# Patient Record
Sex: Female | Born: 1950 | Race: White | Hispanic: No | Marital: Married | Smoking: Never smoker
Health system: Southern US, Community
[De-identification: ages and names within clinical notes are randomized; demographics above are authoritative.]

## PROBLEM LIST (undated history)

## (undated) DIAGNOSIS — R002 Palpitations: Secondary | ICD-10-CM

## (undated) DIAGNOSIS — Z860100 Personal history of colon polyps, unspecified: Secondary | ICD-10-CM

## (undated) DIAGNOSIS — Z9889 Other specified postprocedural states: Secondary | ICD-10-CM

## (undated) DIAGNOSIS — R351 Nocturia: Secondary | ICD-10-CM

## (undated) DIAGNOSIS — I10 Essential (primary) hypertension: Secondary | ICD-10-CM

## (undated) DIAGNOSIS — M1990 Unspecified osteoarthritis, unspecified site: Secondary | ICD-10-CM

## (undated) DIAGNOSIS — R091 Pleurisy: Secondary | ICD-10-CM

## (undated) DIAGNOSIS — E785 Hyperlipidemia, unspecified: Secondary | ICD-10-CM

## (undated) DIAGNOSIS — R112 Nausea with vomiting, unspecified: Secondary | ICD-10-CM

## (undated) DIAGNOSIS — M549 Dorsalgia, unspecified: Secondary | ICD-10-CM

## (undated) DIAGNOSIS — M8580 Other specified disorders of bone density and structure, unspecified site: Secondary | ICD-10-CM

## (undated) DIAGNOSIS — E876 Hypokalemia: Secondary | ICD-10-CM

## (undated) DIAGNOSIS — R3915 Urgency of urination: Secondary | ICD-10-CM

## (undated) DIAGNOSIS — N393 Stress incontinence (female) (male): Secondary | ICD-10-CM

## (undated) HISTORY — PX: LUMBAR DISC SURGERY: SHX700

## (undated) HISTORY — PX: BACK SURGERY: SHX140

## (undated) HISTORY — PX: COLONOSCOPY: SHX174

## (undated) HISTORY — PX: KNEE ARTHROSCOPY W/ MEDIAL COLLATERAL LIGAMENT (MCL) REPAIR: SHX1876

---

## 1996-03-21 HISTORY — PX: TOTAL HIP ARTHROPLASTY: SHX124

## 1996-03-21 HISTORY — PX: OOPHORECTOMY: SHX86

## 2003-12-31 ENCOUNTER — Encounter (INDEPENDENT_AMBULATORY_CARE_PROVIDER_SITE_OTHER): Payer: Self-pay | Admitting: Specialist

## 2003-12-31 ENCOUNTER — Ambulatory Visit (HOSPITAL_COMMUNITY): Admission: RE | Admit: 2003-12-31 | Discharge: 2003-12-31 | Payer: Self-pay | Admitting: Gastroenterology

## 2004-02-25 ENCOUNTER — Other Ambulatory Visit: Admission: RE | Admit: 2004-02-25 | Discharge: 2004-02-25 | Payer: Self-pay | Admitting: Family Medicine

## 2005-03-07 ENCOUNTER — Other Ambulatory Visit: Admission: RE | Admit: 2005-03-07 | Discharge: 2005-03-07 | Payer: Self-pay | Admitting: Family Medicine

## 2005-10-27 ENCOUNTER — Encounter: Admission: RE | Admit: 2005-10-27 | Discharge: 2005-10-27 | Payer: Self-pay | Admitting: Orthopedic Surgery

## 2007-03-28 ENCOUNTER — Other Ambulatory Visit: Admission: RE | Admit: 2007-03-28 | Discharge: 2007-03-28 | Payer: Self-pay | Admitting: Family Medicine

## 2008-01-29 ENCOUNTER — Encounter: Admission: RE | Admit: 2008-01-29 | Discharge: 2008-01-29 | Payer: Self-pay | Admitting: Orthopedic Surgery

## 2009-04-22 ENCOUNTER — Other Ambulatory Visit: Admission: RE | Admit: 2009-04-22 | Discharge: 2009-04-22 | Payer: Self-pay | Admitting: Family Medicine

## 2010-08-06 NOTE — Op Note (Signed)
Kendra Mathis, Kendra Mathis                ACCOUNT NO.:  [id number]   MEDICAL RECORD NO.:  [id number]          PATIENT TYPE:  AMB   LOCATION:  ENDO                         FACILITY:  [facility]   PHYSICIAN:  John C. Madilyn Fireman, M.D.    DATE OF BIRTH:  1950-11-10   DATE OF PROCEDURE:  12/31/2003  DATE OF DISCHARGE:                                 OPERATIVE REPORT   PROCEDURE:  Colonoscopy with polypectomy.   INDICATION FOR PROCEDURE:  Average risk colon cancer screening,   DESCRIPTION OF PROCEDURE:  The patient was placed in the left lateral  decubitus position and placed on the pulse monitor with continuous low-flow  oxygen delivered by nasal cannula.  She was sedated with 50 mcg IV fentanyl  and 6 mg IV Versed.  The Olympus video colonoscope was inserted into the  rectum and advanced to the cecum, confirmed by transillumination at  McBurney's point and visualization of the ileocecal valve and appendiceal  orifice.  The prep was good.  In the base of the cecum, there was a 6 mm  sessile polyp that was fulgurated by hot biopsy.  The remainder of the  cecum, ascending, transverse, descending, sigmoid, and rectum appeared  normal with no further polyps, masses, diverticula, or other mucosal  abnormalities.  The scope was then withdrawn, and the patient returned to  the recovery room in stable condition.  She tolerated the procedure well,  and there were no immediate complications.   IMPRESSION:  1.  A 6 mm cecal polyp.  2.  Otherwise, normal colonoscopy.   PLAN:  Await histology to determine method and interval for future colon  screening.      JCH/MEDQ  D:  12/31/2003  T:  12/31/2003  Job:  [id number]   cc:   Dario Guardian, M.D.  [address]  [place]  [place] [address]  Fax: [phone]

## 2012-05-08 ENCOUNTER — Other Ambulatory Visit (HOSPITAL_COMMUNITY)
Admission: RE | Admit: 2012-05-08 | Discharge: 2012-05-08 | Disposition: A | Discharge status: Home / Self Care | Payer: BC Managed Care – PPO | Source: Ambulatory Visit | Attending: Family Medicine | Admitting: Family Medicine

## 2012-05-08 ENCOUNTER — Other Ambulatory Visit: Payer: Self-pay | Admitting: Family Medicine

## 2012-05-08 DIAGNOSIS — Z0000 Encounter for general adult medical examination without abnormal findings: Secondary | ICD-10-CM | POA: Insufficient documentation

## 2014-07-16 ENCOUNTER — Other Ambulatory Visit: Payer: Self-pay | Admitting: Family Medicine

## 2014-10-27 ENCOUNTER — Ambulatory Visit (INDEPENDENT_AMBULATORY_CARE_PROVIDER_SITE_OTHER): Payer: Medicare Other

## 2014-10-27 ENCOUNTER — Ambulatory Visit (INDEPENDENT_AMBULATORY_CARE_PROVIDER_SITE_OTHER): Payer: Medicare Other | Admitting: Podiatry

## 2014-10-27 VITALS — BP 127/71 | HR 60 | Resp 16

## 2014-10-27 DIAGNOSIS — M1990 Unspecified osteoarthritis, unspecified site: Secondary | ICD-10-CM

## 2014-10-27 DIAGNOSIS — M779 Enthesopathy, unspecified: Secondary | ICD-10-CM | POA: Diagnosis not present

## 2014-10-27 DIAGNOSIS — S96912A Strain of unspecified muscle and tendon at ankle and foot level, left foot, initial encounter: Secondary | ICD-10-CM

## 2014-10-27 MED ORDER — TRIAMCINOLONE ACETONIDE 10 MG/ML IJ SUSP: 10.0000 mg | Freq: Once | INTRAMUSCULAR | Status: DC

## 2014-10-27 NOTE — Progress Notes (Signed)
   Subjective:    Patient ID: Kendra Mathis, female    DOB: 17-Oct-1950, 64 y.o.   MRN: [id number]  HPI Pt presents with pain in left foot on the dorsal area. She states that she has had this pain with knot present for 2 years. Has had multiple injections to relieve pain. She is a Estate agent. Pain is worse after resting then ambulating again   Review of Systems  All other systems reviewed and are negative.      Objective:   Physical Exam        Assessment & Plan:

## 2014-10-28 NOTE — Progress Notes (Signed)
Subjective:     Patient ID: Kendra Mathis, female   DOB: 1950/05/26, 64 y.o.   MRN: [id number]  HPI patient states I've had pain on top of my left foot for a long time and I've got periodic injections from Dr. Thurston Hole and I want to see if there's anything else shoe could do an with you could do the injections. States that they help for a while but she has to have them periodically   Review of Systems  All other systems reviewed and are negative.      Objective:   Physical Exam  Constitutional: She is oriented to person, place, and time.  Cardiovascular: Intact distal pulses.   Musculoskeletal: Normal range of motion.  Neurological: She is oriented to person, place, and time.  Skin: Skin is warm.  Nursing note and vitals reviewed.  neurovascular status intact muscle strength adequate range of motion within normal limits with patient having inflammation and pain on top of the left foot midtarsal joint with fluid buildup noted and prominence indicating probable spur formation. 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 At this time I did a careful dorsal injection 3 mg Kenalog 5 mg Xylocaine and advised on heat ice therapy and loose-type shoes along with padding. Reappoint when symptomatic and ultimately this could require fusion

## 2014-10-30 DIAGNOSIS — M545 Low back pain: Secondary | ICD-10-CM | POA: Diagnosis not present

## 2014-10-30 DIAGNOSIS — M19072 Primary osteoarthritis, left ankle and foot: Secondary | ICD-10-CM | POA: Diagnosis not present

## 2014-10-30 DIAGNOSIS — M25551 Pain in right hip: Secondary | ICD-10-CM | POA: Diagnosis not present

## 2014-11-04 DIAGNOSIS — Z09 Encounter for follow-up examination after completed treatment for conditions other than malignant neoplasm: Secondary | ICD-10-CM | POA: Diagnosis not present

## 2014-11-04 DIAGNOSIS — M25551 Pain in right hip: Secondary | ICD-10-CM | POA: Diagnosis not present

## 2014-11-04 DIAGNOSIS — Z96641 Presence of right artificial hip joint: Secondary | ICD-10-CM | POA: Diagnosis not present

## 2014-11-12 ENCOUNTER — Other Ambulatory Visit: Payer: Self-pay | Admitting: Orthopedic Surgery

## 2014-12-18 ENCOUNTER — Encounter (HOSPITAL_COMMUNITY)
Admission: RE | Admit: 2014-12-18 | Discharge: 2014-12-18 | Disposition: A | Discharge status: Home / Self Care | Payer: Medicare Other | Source: Ambulatory Visit | Attending: Orthopedic Surgery | Admitting: Orthopedic Surgery

## 2014-12-18 ENCOUNTER — Encounter (HOSPITAL_COMMUNITY): Payer: Self-pay

## 2014-12-18 DIAGNOSIS — R9431 Abnormal electrocardiogram [ECG] [EKG]: Secondary | ICD-10-CM | POA: Insufficient documentation

## 2014-12-18 DIAGNOSIS — Z01818 Encounter for other preprocedural examination: Secondary | ICD-10-CM | POA: Insufficient documentation

## 2014-12-18 DIAGNOSIS — Z01812 Encounter for preprocedural laboratory examination: Secondary | ICD-10-CM | POA: Diagnosis not present

## 2014-12-18 DIAGNOSIS — Z0183 Encounter for blood typing: Secondary | ICD-10-CM | POA: Insufficient documentation

## 2014-12-18 DIAGNOSIS — I10 Essential (primary) hypertension: Secondary | ICD-10-CM | POA: Diagnosis not present

## 2014-12-18 DIAGNOSIS — R002 Palpitations: Secondary | ICD-10-CM | POA: Insufficient documentation

## 2014-12-18 HISTORY — DX: Other specified postprocedural states: Z98.890

## 2014-12-18 HISTORY — DX: Other specified disorders of bone density and structure, unspecified site: M85.80

## 2014-12-18 HISTORY — DX: Stress incontinence (female) (male): N39.3

## 2014-12-18 HISTORY — DX: Dorsalgia, unspecified: M54.9

## 2014-12-18 HISTORY — DX: Personal history of colonic polyps: Z86.010

## 2014-12-18 HISTORY — DX: Personal history of colon polyps, unspecified: Z86.0100

## 2014-12-18 HISTORY — DX: Palpitations: R00.2

## 2014-12-18 HISTORY — DX: Nocturia: R35.1

## 2014-12-18 HISTORY — DX: Unspecified osteoarthritis, unspecified site: M19.90

## 2014-12-18 HISTORY — DX: Hyperlipidemia, unspecified: E78.5

## 2014-12-18 HISTORY — DX: Essential (primary) hypertension: I10

## 2014-12-18 HISTORY — DX: Pleurisy: R09.1

## 2014-12-18 HISTORY — DX: Other specified postprocedural states: R11.2

## 2014-12-18 HISTORY — DX: Urgency of urination: R39.15

## 2014-12-18 LAB — BASIC METABOLIC PANEL
Anion gap: 8 (ref 5–15)
BUN: 24 mg/dL — AB (ref 6–20)
CHLORIDE: 106 mmol/L (ref 101–111)
CO2: 27 mmol/L (ref 22–32)
CREATININE: 0.86 mg/dL (ref 0.44–1.00)
Calcium: 9.7 mg/dL (ref 8.9–10.3)
GFR calc Af Amer: >60 mL/min (ref >60)
GFR calc non Af Amer: >60 mL/min (ref >60)
Glucose, Bld: 95 mg/dL (ref 65–99)
Potassium: 3.9 mmol/L (ref 3.5–5.1)
SODIUM: 141 mmol/L (ref 135–145)

## 2014-12-18 LAB — CBC WITH DIFFERENTIAL/PLATELET
Basophils Absolute: 0 10*3/uL (ref 0.0–0.1)
Basophils Relative: 0 %
EOS ABS: 0.1 10*3/uL (ref 0.0–0.7)
EOS PCT: 1 %
HCT: 39.4 % (ref 36.0–46.0)
Hemoglobin: 12.8 g/dL (ref 12.0–15.0)
LYMPHS ABS: 2 10*3/uL (ref 0.7–4.0)
Lymphocytes Relative: 31 %
MCH: 29.5 pg (ref 26.0–34.0)
MCHC: 32.5 g/dL (ref 30.0–36.0)
MCV: 90.8 fL (ref 78.0–100.0)
MONOS PCT: 6 %
Monocytes Absolute: 0.4 10*3/uL (ref 0.1–1.0)
Neutro Abs: 3.8 10*3/uL (ref 1.7–7.7)
Neutrophils Relative %: 62 %
PLATELETS: 274 10*3/uL (ref 150–400)
RBC: 4.34 MIL/uL (ref 3.87–5.11)
RDW: 14.4 % (ref 11.5–15.5)
WBC: 6.2 10*3/uL (ref 4.0–10.5)

## 2014-12-18 LAB — URINALYSIS, ROUTINE W REFLEX MICROSCOPIC
Bilirubin Urine: NEGATIVE
Glucose, UA: NEGATIVE mg/dL
Hgb urine dipstick: NEGATIVE
Ketones, ur: NEGATIVE mg/dL
LEUKOCYTES UA: NEGATIVE
NITRITE: NEGATIVE
PH: 5.5 (ref 5.0–8.0)
Protein, ur: NEGATIVE mg/dL
SPECIFIC GRAVITY, URINE: 1.022 (ref 1.005–1.030)
Urobilinogen, UA: 0.2 mg/dL (ref 0.0–1.0)

## 2014-12-18 LAB — TYPE AND SCREEN
ABO/RH(D): A POS
Antibody Screen: NEGATIVE

## 2014-12-18 LAB — PROTIME-INR
INR: 1.03 (ref 0.00–1.49)
PROTHROMBIN TIME: 13.7 s (ref 11.6–15.2)

## 2014-12-18 LAB — APTT: APTT: 26 s (ref 24–37)

## 2014-12-18 LAB — SURGICAL PCR SCREEN
MRSA, PCR: NEGATIVE
Staphylococcus aureus: POSITIVE — AB

## 2014-12-18 LAB — ABO/RH: ABO/RH(D): A POS

## 2014-12-18 MED ORDER — CHLORHEXIDINE GLUCONATE 4 % EX LIQD: 60.0000 mL | Freq: Once | CUTANEOUS | Status: DC

## 2014-12-18 NOTE — Progress Notes (Signed)
Mupirocin script called into [facility]

## 2014-12-18 NOTE — Progress Notes (Addendum)
Cardiologist denies having one  Medical Md is Dr.Candace Katrinka Blazing  Echo denies ever having one  Stress test > 59yrs ago  Heart cath denies ever having one  EKG denies having one in past yr   CXR denies having one in past yr

## 2014-12-18 NOTE — Pre-Procedure Instructions (Signed)
Kendra Mathis  12/18/2014      [facility] [id number] - [address], [place] - [address] [place] [address] Phone: [phone] Fax: [phone]    Your procedure is scheduled on Mon, Oct 10 @ 10:00 AM  Report to [facility] Admitting at 8:00 AM  Call this number if you have problems the morning of surgery:  [phone]   Remember:  Do not eat food or drink liquids after midnight.  Take these medicines the morning of surgery with A SIP OF WATER Propranolol(Inderal)              Stop taking your Naproxen along with any Vitamins or Herbal Medications a week prior to surgery. No Goody's,BC's,Aspirin,Ibuprofen,or Fish Oil.   Do not wear jewelry, make-up or nail polish.  Do not wear lotions, powders, or perfumes.  You may wear deodorant.  Do not shave 48 hours prior to surgery.   Do not bring valuables to the hospital.  [facility] is not responsible for any belongings or valuables.  Contacts, dentures or bridgework may not be worn into surgery.  Leave your suitcase in the car.  After surgery it may be brought to your room.  For patients admitted to the hospital, discharge time will be determined by your treatment team.  Patients discharged the day of surgery will not be allowed to drive home.    Special instructions:  [facility] - Preparing for Surgery  Before surgery, you can play an important role.  Because skin is not sterile, your skin needs to be as free of germs as possible.  You can reduce the number of germs on you skin by washing with CHG (chlorahexidine gluconate) soap before surgery.  CHG is an antiseptic cleaner which kills germs and bonds with the skin to continue killing germs even after washing.  Please DO NOT use if you have an allergy to CHG or antibacterial soaps.  If your skin becomes reddened/irritated stop using the CHG and inform your nurse when you arrive at Short Stay.  Do not shave (including legs and  underarms) for at least 48 hours prior to the first CHG shower.  You may shave your face.  Please follow these instructions carefully:   1.  Shower with CHG Soap the night before surgery and the                                morning of Surgery.  2.  If you choose to wash your hair, wash your hair first as usual with your       normal shampoo.  3.  After you shampoo, rinse your hair and body thoroughly to remove the                      Shampoo.  4.  Use CHG as you would any other liquid soap.  You can apply chg directly       to the skin and wash gently with scrungie or a clean washcloth.  5.  Apply the CHG Soap to your body ONLY FROM THE NECK DOWN.        Do not use on open wounds or open sores.  Avoid contact with your eyes,       ears, mouth and genitals (private parts).  Wash genitals (private parts)       with your normal soap.  6.  Wash thoroughly, paying special attention to the area where your surgery        will be performed.  7.  Thoroughly rinse your body with warm water from the neck down.  8.  DO NOT shower/wash with your normal soap after using and rinsing off       the CHG Soap.  9.  Pat yourself dry with a clean towel.            10.  Wear clean pajamas.            11.  Place clean sheets on your bed the night of your first shower and do not        sleep with pets.  Day of Surgery  Do not apply any lotions/deoderants the morning of surgery.  Please wear clean clothes to the hospital/surgery center.    Please read over the following fact sheets that you were given. Pain Booklet, Coughing and Deep Breathing, Blood Transfusion Information, MRSA Information and Surgical Site Infection Prevention

## 2014-12-26 NOTE — H&P (Signed)
TOTAL HIP REVISION ADMISSION H&P  Patient is admitted for right revision total hip arthroplasty.  Subjective:  Chief Complaint: right hip pain  HPI: Kendra Mathis, 64 y.o. female, has a history of pain and functional disability in the right hip due to arthritis and patient has failed non-surgical conservative treatments for greater than 12 weeks to include NSAID's and/or analgesics, flexibility and strengthening excercises, use of assistive devices, weight reduction as appropriate and activity modification. The indications for the revision total hip arthroplasty are bearing surface wear leading to  implant or hip misalignment.  Onset of symptoms was gradual starting 2 years ago with gradually worsening course since that time.  Prior procedures on the right hip include arthroplasty.  Patient currently rates pain in the right hip at 10 out of 10 with activity.  There is night pain, worsening of pain with activity and weight bearing, pain that interfers with activities of daily living and pain with passive range of motion. Patient has evidence of poly wear by imaging studies.  This condition presents safety issues increasing the risk of falls.    There is no current active infection.  There are no active problems to display for this patient.  Past Medical History  Diagnosis Date  . PONV (postoperative nausea and vomiting)   . Palpitations     takes Inderal daily  . Hypertension     takes Lisinopril-HCTZ daily  . Hyperlipidemia     hx of-tried Lovastatin but joint aches -has been off for several yrs   . Pleurisy     > 15 yrs ago  . Arthritis   . Back pain     scoliosis  . Osteopenia     takes Evista every other day  . History of colon polyps     benign  . Stress incontinence   . Urinary urgency   . Nocturia     Past Surgical History  Procedure Laterality Date  . Cesarean section    . Back surgery    . Ovary removed    . Joint replacement Right     hip replacement  . Knee  arthroscopy w/ medial collateral ligament (mcl) repair Left   . Colonoscopy      No prescriptions prior to admission   Allergies  Allergen Reactions  . Codeine Nausea Only    Social History  Substance Use Topics  . Smoking status: Never Smoker   . Smokeless tobacco: Not on file  . Alcohol Use: Yes     Comment: occasionally    No family history on file.    Review of Systems  Constitutional: Negative.   HENT: Positive for tinnitus.   Eyes: Negative.   Respiratory: Negative.   Cardiovascular: Negative.   Gastrointestinal: Negative.   Genitourinary: Negative.   Musculoskeletal: Positive for joint pain.  Skin: Negative.   Neurological: Negative.   Endo/Heme/Allergies: Negative.   Psychiatric/Behavioral: Negative.     Objective:  Physical Exam  Constitutional: She is oriented to person, place, and time. She appears well-developed and well-nourished.  HENT:  Head: Normocephalic and atraumatic.  Eyes: Pupils are equal, round, and reactive to light.  Neck: Normal range of motion. Neck supple.  Cardiovascular: Intact distal pulses.   Respiratory: Effort normal.  Musculoskeletal:  Internal and external rotation is 30 each foot tap is negative wound is well-healed there is no erythema and no swelling.    Neurological: She is alert and oriented to person, place, and time.  Skin: Skin is warm and dry.  Psychiatric: She has a normal mood and affect. Her behavior is normal. Judgment and thought content normal.    Vital signs in last 24 hours:     Labs:   There is no height or weight on file to calculate BMI.  Imaging Review:  Plain radiographs demonstrate the 26 mm head is now within 2 mm of the acetabular shell and locking ring superiorly, this is another 2-3 mm of migration compared to films were shot in 2009.  Assessment/Plan:  End stage arthritis, right hip(s) with failed previous arthroplasty.  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 Revision total hip arthroplasty is deemed medically necessary. The treatment options including medical management, injection therapy, arthroscopy and arthroplasty were discussed at length. The risks and benefits of total hip arthroplasty were presented and reviewed. The risks due to aseptic loosening, infection, stiffness, dislocation/subluxation,  thromboembolic complications and other imponderables were discussed.  The patient acknowledged the explanation, agreed to proceed with the plan and consent was signed. Patient is being admitted for inpatient treatment for surgery, pain control, PT, OT, prophylactic antibiotics, VTE prophylaxis, progressive ambulation and ADL's and discharge planning. The patient is planning to be discharged home with home health services

## 2014-12-26 NOTE — Progress Notes (Signed)
Patient returned call and voiced understanding about time of arrival.

## 2014-12-26 NOTE — Progress Notes (Signed)
Nurse called patient and left a voicemail instructing her to arrive at short stay at 0700 instead of 0800. Nurse also requested that patient return call at her earliest convenience to ensure she received this message.

## 2014-12-28 DIAGNOSIS — T84068A Wear of articular bearing surface of other internal prosthetic joint, initial encounter: Secondary | ICD-10-CM

## 2014-12-28 DIAGNOSIS — Z96649 Presence of unspecified artificial hip joint: Secondary | ICD-10-CM

## 2014-12-28 MED ORDER — TRANEXAMIC ACID 1000 MG/10ML IV SOLN
1000.0000 mg | INTRAVENOUS | Status: AC
  Administered 2014-12-29: 1000 mg via INTRAVENOUS
  Filled 2014-12-28: qty 10

## 2014-12-29 ENCOUNTER — Inpatient Hospital Stay (HOSPITAL_COMMUNITY): Payer: Medicare Other | Admitting: Anesthesiology

## 2014-12-29 ENCOUNTER — Encounter (HOSPITAL_COMMUNITY)
Admission: RE | Disposition: A | Discharge status: Home Health | Payer: Self-pay | Source: Ambulatory Visit | Attending: Orthopedic Surgery

## 2014-12-29 ENCOUNTER — Encounter (HOSPITAL_COMMUNITY): Payer: Self-pay | Admitting: General Practice

## 2014-12-29 ENCOUNTER — Inpatient Hospital Stay (HOSPITAL_COMMUNITY): Payer: Medicare Other

## 2014-12-29 ENCOUNTER — Inpatient Hospital Stay (HOSPITAL_COMMUNITY)
Admission: RE | Admit: 2014-12-29 | Discharge: 2014-12-30 | DRG: 467 | Disposition: A | Discharge status: Home Health | Payer: Medicare Other | Source: Ambulatory Visit | Attending: Orthopedic Surgery | Admitting: Orthopedic Surgery

## 2014-12-29 DIAGNOSIS — M549 Dorsalgia, unspecified: Secondary | ICD-10-CM | POA: Diagnosis not present

## 2014-12-29 DIAGNOSIS — Z23 Encounter for immunization: Secondary | ICD-10-CM | POA: Diagnosis not present

## 2014-12-29 DIAGNOSIS — M8580 Other specified disorders of bone density and structure, unspecified site: Secondary | ICD-10-CM | POA: Diagnosis present

## 2014-12-29 DIAGNOSIS — T84060A Wear of articular bearing surface of internal prosthetic right hip joint, initial encounter: Secondary | ICD-10-CM | POA: Diagnosis present

## 2014-12-29 DIAGNOSIS — M1611 Unilateral primary osteoarthritis, right hip: Secondary | ICD-10-CM | POA: Diagnosis present

## 2014-12-29 DIAGNOSIS — D62 Acute posthemorrhagic anemia: Secondary | ICD-10-CM | POA: Diagnosis not present

## 2014-12-29 DIAGNOSIS — T84068A Wear of articular bearing surface of other internal prosthetic joint, initial encounter: Secondary | ICD-10-CM

## 2014-12-29 DIAGNOSIS — E785 Hyperlipidemia, unspecified: Secondary | ICD-10-CM | POA: Diagnosis present

## 2014-12-29 DIAGNOSIS — M1990 Unspecified osteoarthritis, unspecified site: Secondary | ICD-10-CM | POA: Diagnosis not present

## 2014-12-29 DIAGNOSIS — I10 Essential (primary) hypertension: Secondary | ICD-10-CM | POA: Diagnosis present

## 2014-12-29 DIAGNOSIS — Z471 Aftercare following joint replacement surgery: Secondary | ICD-10-CM | POA: Diagnosis not present

## 2014-12-29 DIAGNOSIS — Z96641 Presence of right artificial hip joint: Secondary | ICD-10-CM | POA: Diagnosis not present

## 2014-12-29 DIAGNOSIS — Z96643 Presence of artificial hip joint, bilateral: Secondary | ICD-10-CM

## 2014-12-29 DIAGNOSIS — Z96649 Presence of unspecified artificial hip joint: Secondary | ICD-10-CM

## 2014-12-29 DIAGNOSIS — Z885 Allergy status to narcotic agent status: Secondary | ICD-10-CM | POA: Diagnosis not present

## 2014-12-29 DIAGNOSIS — M25551 Pain in right hip: Secondary | ICD-10-CM | POA: Diagnosis not present

## 2014-12-29 HISTORY — PX: REVISION TOTAL HIP ARTHROPLASTY: SHX766

## 2014-12-29 HISTORY — PX: TOTAL HIP REVISION: SHX763

## 2014-12-29 SURGERY — TOTAL HIP REVISION
Anesthesia: Monitor Anesthesia Care | Site: Hip | Laterality: Right

## 2014-12-29 MED ORDER — HYDROMORPHONE HCL 1 MG/ML IJ SOLN
0.5000 mg | INTRAMUSCULAR | Status: DC | PRN
Start: 2014-12-29 — End: 2014-12-30
  Administered 2014-12-29: 1 mg via INTRAVENOUS
  Administered 2014-12-29 – 2014-12-30 (×2): 0.5 mg via INTRAVENOUS
  Filled 2014-12-29 (×3): qty 1

## 2014-12-29 MED ORDER — INFLUENZA VAC SPLIT QUAD 0.5 ML IM SUSY
0.5000 mL | PREFILLED_SYRINGE | INTRAMUSCULAR | Status: AC
  Administered 2014-12-30: 0.5 mL via INTRAMUSCULAR
  Filled 2014-12-29: qty 0.5

## 2014-12-29 MED ORDER — ONDANSETRON HCL 4 MG PO TABS
4.0000 mg | ORAL_TABLET | Freq: Four times a day (QID) | ORAL | Status: DC | PRN
  Filled 2014-12-29: qty 1

## 2014-12-29 MED ORDER — LACTATED RINGERS IV SOLN
INTRAVENOUS | Status: DC | PRN
  Administered 2014-12-29 (×2): via INTRAVENOUS

## 2014-12-29 MED ORDER — HYDROCHLOROTHIAZIDE 12.5 MG PO CAPS
12.5000 mg | ORAL_CAPSULE | Freq: Every day | ORAL | Status: DC
  Administered 2014-12-29 – 2014-12-30 (×2): 12.5 mg via ORAL
  Filled 2014-12-29 (×2): qty 1

## 2014-12-29 MED ORDER — METOCLOPRAMIDE HCL 5 MG/ML IJ SOLN: 5.0000 mg | Freq: Three times a day (TID) | INTRAMUSCULAR | Status: DC | PRN

## 2014-12-29 MED ORDER — PROPRANOLOL HCL 40 MG PO TABS
40.0000 mg | ORAL_TABLET | Freq: Three times a day (TID) | ORAL | Status: DC
  Administered 2014-12-29 – 2014-12-30 (×3): 40 mg via ORAL
  Filled 2014-12-29 (×6): qty 1

## 2014-12-29 MED ORDER — SODIUM CHLORIDE 0.9 % IR SOLN
Status: DC | PRN
  Administered 2014-12-29: 1000 mL

## 2014-12-29 MED ORDER — PROPOFOL 10 MG/ML IV BOLUS
INTRAVENOUS | Status: DC | PRN
  Administered 2014-12-29 (×2): 10 mg via INTRAVENOUS
  Administered 2014-12-29: 20 mg via INTRAVENOUS

## 2014-12-29 MED ORDER — MIDAZOLAM HCL 2 MG/2ML IJ SOLN
INTRAMUSCULAR | Status: AC
  Filled 2014-12-29: qty 4

## 2014-12-29 MED ORDER — OXYCODONE HCL 5 MG PO TABS
ORAL_TABLET | ORAL | Status: AC
  Administered 2014-12-29: 10 mg via ORAL
  Filled 2014-12-29: qty 2

## 2014-12-29 MED ORDER — LACTATED RINGERS IV SOLN
INTRAVENOUS | Status: DC
  Administered 2014-12-29: 08:00:00 via INTRAVENOUS

## 2014-12-29 MED ORDER — ALUM & MAG HYDROXIDE-SIMETH 200-200-20 MG/5ML PO SUSP: 30.0000 mL | ORAL | Status: DC | PRN

## 2014-12-29 MED ORDER — OXYCODONE HCL 5 MG PO TABS
5.0000 mg | ORAL_TABLET | ORAL | Status: DC | PRN
  Administered 2014-12-29 (×3): 10 mg via ORAL
  Filled 2014-12-29 (×3): qty 2

## 2014-12-29 MED ORDER — PHENYLEPHRINE 40 MCG/ML (10ML) SYRINGE FOR IV PUSH (FOR BLOOD PRESSURE SUPPORT)
PREFILLED_SYRINGE | INTRAVENOUS | Status: AC
  Filled 2014-12-29: qty 10

## 2014-12-29 MED ORDER — ONDANSETRON HCL 4 MG/2ML IJ SOLN
4.0000 mg | Freq: Four times a day (QID) | INTRAMUSCULAR | Status: DC | PRN
  Administered 2014-12-29: 4 mg via INTRAVENOUS
  Filled 2014-12-29: qty 2

## 2014-12-29 MED ORDER — MENTHOL 3 MG MT LOZG: 1.0000 | LOZENGE | OROMUCOSAL | Status: DC | PRN

## 2014-12-29 MED ORDER — FENTANYL CITRATE (PF) 100 MCG/2ML IJ SOLN
INTRAMUSCULAR | Status: AC
  Filled 2014-12-29: qty 2

## 2014-12-29 MED ORDER — DEXAMETHASONE SODIUM PHOSPHATE 10 MG/ML IJ SOLN
10.0000 mg | Freq: Once | INTRAMUSCULAR | Status: AC
  Administered 2014-12-30: 10 mg via INTRAVENOUS
  Filled 2014-12-29: qty 1

## 2014-12-29 MED ORDER — FENTANYL CITRATE (PF) 250 MCG/5ML IJ SOLN
INTRAMUSCULAR | Status: AC
  Filled 2014-12-29: qty 5

## 2014-12-29 MED ORDER — DEXTROSE-NACL 5-0.45 % IV SOLN: INTRAVENOUS | Status: DC

## 2014-12-29 MED ORDER — BISACODYL 5 MG PO TBEC: 5.0000 mg | DELAYED_RELEASE_TABLET | Freq: Every day | ORAL | Status: DC | PRN

## 2014-12-29 MED ORDER — LISINOPRIL 20 MG PO TABS
20.0000 mg | ORAL_TABLET | Freq: Every day | ORAL | Status: DC
  Administered 2014-12-29 – 2014-12-30 (×2): 20 mg via ORAL
  Filled 2014-12-29 (×2): qty 1

## 2014-12-29 MED ORDER — DOCUSATE SODIUM 100 MG PO CAPS
100.0000 mg | ORAL_CAPSULE | Freq: Every day | ORAL | Status: DC | PRN
  Filled 2014-12-29: qty 1

## 2014-12-29 MED ORDER — ASPIRIN EC 325 MG PO TBEC
325.0000 mg | DELAYED_RELEASE_TABLET | Freq: Every day | ORAL | Status: DC
  Administered 2014-12-30: 325 mg via ORAL
  Filled 2014-12-29 (×2): qty 1

## 2014-12-29 MED ORDER — KCL IN DEXTROSE-NACL 20-5-0.45 MEQ/L-%-% IV SOLN: INTRAVENOUS | Status: DC

## 2014-12-29 MED ORDER — CEFAZOLIN SODIUM-DEXTROSE 2-3 GM-% IV SOLR
2.0000 g | INTRAVENOUS | Status: AC
  Administered 2014-12-29: 2 g via INTRAVENOUS
  Filled 2014-12-29: qty 50

## 2014-12-29 MED ORDER — ONDANSETRON HCL 4 MG/2ML IJ SOLN: 4.0000 mg | Freq: Once | INTRAMUSCULAR | Status: DC | PRN

## 2014-12-29 MED ORDER — PHENOL 1.4 % MT LIQD: 1.0000 | OROMUCOSAL | Status: DC | PRN

## 2014-12-29 MED ORDER — FENTANYL CITRATE (PF) 100 MCG/2ML IJ SOLN
25.0000 ug | INTRAMUSCULAR | Status: DC | PRN
  Administered 2014-12-29: 50 ug via INTRAVENOUS

## 2014-12-29 MED ORDER — METHOCARBAMOL 500 MG PO TABS
500.0000 mg | ORAL_TABLET | Freq: Four times a day (QID) | ORAL | Status: DC | PRN
  Administered 2014-12-29 – 2014-12-30 (×3): 500 mg via ORAL
  Filled 2014-12-29 (×4): qty 1

## 2014-12-29 MED ORDER — PROMETHAZINE HCL 25 MG/ML IJ SOLN
12.5000 mg | Freq: Four times a day (QID) | INTRAMUSCULAR | Status: DC | PRN
Start: 2014-12-29 — End: 2014-12-30
  Administered 2014-12-29: 12.5 mg via INTRAVENOUS
  Filled 2014-12-29 (×2): qty 1

## 2014-12-29 MED ORDER — FENTANYL CITRATE (PF) 100 MCG/2ML IJ SOLN
INTRAMUSCULAR | Status: DC | PRN
  Administered 2014-12-29: 50 ug via INTRAVENOUS

## 2014-12-29 MED ORDER — ACETAMINOPHEN 325 MG PO TABS
ORAL_TABLET | ORAL | Status: AC
  Administered 2014-12-29: 650 mg
  Filled 2014-12-29: qty 2

## 2014-12-29 MED ORDER — ACETAMINOPHEN 325 MG PO TABS
650.0000 mg | ORAL_TABLET | Freq: Four times a day (QID) | ORAL | Status: DC | PRN
  Administered 2014-12-30: 650 mg via ORAL
  Filled 2014-12-29: qty 2

## 2014-12-29 MED ORDER — FLEET ENEMA 7-19 GM/118ML RE ENEM
1.0000 | ENEMA | Freq: Once | RECTAL | Status: DC | PRN
Start: 2014-12-29 — End: 2014-12-30

## 2014-12-29 MED ORDER — PHENYLEPHRINE HCL 10 MG/ML IJ SOLN
INTRAMUSCULAR | Status: DC | PRN
  Administered 2014-12-29: 120 ug via INTRAVENOUS
  Administered 2014-12-29: 80 ug via INTRAVENOUS
  Administered 2014-12-29 (×2): 120 ug via INTRAVENOUS
  Administered 2014-12-29 (×2): 80 ug via INTRAVENOUS

## 2014-12-29 MED ORDER — ROCURONIUM BROMIDE 50 MG/5ML IV SOLN
INTRAVENOUS | Status: AC
  Filled 2014-12-29: qty 1

## 2014-12-29 MED ORDER — RALOXIFENE HCL 60 MG PO TABS
60.0000 mg | ORAL_TABLET | Freq: Every day | ORAL | Status: DC
  Administered 2014-12-29 – 2014-12-30 (×2): 60 mg via ORAL
  Filled 2014-12-29 (×2): qty 1

## 2014-12-29 MED ORDER — ACETAMINOPHEN 650 MG RE SUPP: 650.0000 mg | Freq: Four times a day (QID) | RECTAL | Status: DC | PRN

## 2014-12-29 MED ORDER — METHOCARBAMOL 500 MG PO TABS
ORAL_TABLET | ORAL | Status: AC
  Administered 2014-12-29: 500 mg via ORAL
  Filled 2014-12-29: qty 1

## 2014-12-29 MED ORDER — MIDAZOLAM HCL 5 MG/5ML IJ SOLN
INTRAMUSCULAR | Status: DC | PRN
  Administered 2014-12-29: 2 mg via INTRAVENOUS

## 2014-12-29 MED ORDER — METOCLOPRAMIDE HCL 5 MG PO TABS: 5.0000 mg | ORAL_TABLET | Freq: Three times a day (TID) | ORAL | Status: DC | PRN

## 2014-12-29 MED ORDER — BUPIVACAINE-EPINEPHRINE (PF) 0.5% -1:200000 IJ SOLN
INTRAMUSCULAR | Status: AC
  Filled 2014-12-29: qty 30

## 2014-12-29 MED ORDER — METHOCARBAMOL 1000 MG/10ML IJ SOLN
500.0000 mg | Freq: Four times a day (QID) | INTRAVENOUS | Status: DC | PRN
  Filled 2014-12-29: qty 5

## 2014-12-29 MED ORDER — LISINOPRIL-HYDROCHLOROTHIAZIDE 20-12.5 MG PO TABS: 1.0000 | ORAL_TABLET | Freq: Every day | ORAL | Status: DC

## 2014-12-29 MED ORDER — DIPHENHYDRAMINE HCL 12.5 MG/5ML PO ELIX: 12.5000 mg | ORAL_SOLUTION | ORAL | Status: DC | PRN

## 2014-12-29 MED ORDER — POLYETHYLENE GLYCOL 3350 17 G PO PACK: 17.0000 g | PACK | Freq: Every day | ORAL | Status: DC | PRN

## 2014-12-29 MED ORDER — PROPOFOL 500 MG/50ML IV EMUL
INTRAVENOUS | Status: DC | PRN
  Administered 2014-12-29: 50 ug/kg/min via INTRAVENOUS

## 2014-12-29 MED ORDER — BUPIVACAINE-EPINEPHRINE 0.5% -1:200000 IJ SOLN
INTRAMUSCULAR | Status: DC | PRN
  Administered 2014-12-29: 20 mL

## 2014-12-29 MED ORDER — DOCUSATE SODIUM 100 MG PO CAPS
100.0000 mg | ORAL_CAPSULE | Freq: Two times a day (BID) | ORAL | Status: DC
  Administered 2014-12-29 – 2014-12-30 (×3): 100 mg via ORAL
  Filled 2014-12-29 (×2): qty 1

## 2014-12-29 MED ORDER — LIDOCAINE HCL (CARDIAC) 20 MG/ML IV SOLN
INTRAVENOUS | Status: DC | PRN
  Administered 2014-12-29: 30 mg via INTRAVENOUS

## 2014-12-29 SURGICAL SUPPLY — 67 items
ADAPTER SLEEVE UNV CTAPER P0 (Hips) ×3 IMPLANT
BLADE SAW SAG 73X25 THK (BLADE)
BLADE SAW SGTL 73X25 THK (BLADE) IMPLANT
BRUSH FEMORAL CANAL (MISCELLANEOUS) IMPLANT
COVER SURGICAL LIGHT HANDLE (MISCELLANEOUS) ×3 IMPLANT
DRAPE C-ARM 42X72 X-RAY (DRAPES) IMPLANT
DRAPE IMP U-DRAPE 54X76 (DRAPES) ×3 IMPLANT
DRAPE ORTHO SPLIT 77X108 STRL (DRAPES) ×2
DRAPE PROXIMA HALF (DRAPES) ×3 IMPLANT
DRAPE SURG ORHT 6 SPLT 77X108 (DRAPES) ×1 IMPLANT
DRAPE U-SHAPE 47X51 STRL (DRAPES) ×3 IMPLANT
DRILL BIT 7/64X5 (BIT) ×3 IMPLANT
DRSG AQUACEL AG ADV 3.5X10 (GAUZE/BANDAGES/DRESSINGS) ×3 IMPLANT
DURAPREP 26ML APPLICATOR (WOUND CARE) ×3 IMPLANT
ELECT BLADE 4.0 EZ CLEAN MEGAD (MISCELLANEOUS) ×3
ELECT BLADE 6.5 EXT (BLADE) ×3 IMPLANT
ELECT REM PT RETURN 9FT ADLT (ELECTROSURGICAL) ×3
ELECTRODE BLDE 4.0 EZ CLN MEGD (MISCELLANEOUS) ×1 IMPLANT
ELECTRODE REM PT RTRN 9FT ADLT (ELECTROSURGICAL) ×1 IMPLANT
EVACUATOR 1/8 PVC DRAIN (DRAIN) IMPLANT
GAUZE SPONGE 4X4 12PLY STRL (GAUZE/BANDAGES/DRESSINGS) ×3 IMPLANT
GAUZE XEROFORM 5X9 LF (GAUZE/BANDAGES/DRESSINGS) ×3 IMPLANT
GLOVE BIO SURGEON STRL SZ7.5 (GLOVE) ×3 IMPLANT
GLOVE BIO SURGEON STRL SZ8.5 (GLOVE) ×6 IMPLANT
GLOVE BIOGEL PI IND STRL 8 (GLOVE) ×2 IMPLANT
GLOVE BIOGEL PI IND STRL 9 (GLOVE) ×1 IMPLANT
GLOVE BIOGEL PI INDICATOR 8 (GLOVE) ×4
GLOVE BIOGEL PI INDICATOR 9 (GLOVE) ×2
GOWN STRL REUS W/ TWL LRG LVL3 (GOWN DISPOSABLE) ×1 IMPLANT
GOWN STRL REUS W/ TWL XL LVL3 (GOWN DISPOSABLE) ×2 IMPLANT
GOWN STRL REUS W/TWL LRG LVL3 (GOWN DISPOSABLE) ×2
GOWN STRL REUS W/TWL XL LVL3 (GOWN DISPOSABLE) ×4
HANDPIECE INTERPULSE COAX TIP (DISPOSABLE) ×2
HEAD FEM BIOLOX DELTA UN 28 (Orthopedic Implant) ×3 IMPLANT
HOOD PEEL AWAY FACE SHEILD DIS (HOOD) ×6 IMPLANT
INSERT ACTB 10D 46X28XSTRL LF (Orthopedic Implant) ×1 IMPLANT
INSERT OSTEO CFIRE ACET (Orthopedic Implant) ×2 IMPLANT
INSRT ACTB 10D 46X28XSTRL LF (Orthopedic Implant) ×1 IMPLANT
KIT BASIN OR (CUSTOM PROCEDURE TRAY) ×3 IMPLANT
KIT ROOM TURNOVER OR (KITS) ×3 IMPLANT
MANIFOLD NEPTUNE II (INSTRUMENTS) ×3 IMPLANT
NEEDLE 22X1 1/2 (OR ONLY) (NEEDLE) ×3 IMPLANT
NS IRRIG 1000ML POUR BTL (IV SOLUTION) ×6 IMPLANT
PACK TOTAL JOINT (CUSTOM PROCEDURE TRAY) ×3 IMPLANT
PACK UNIVERSAL I (CUSTOM PROCEDURE TRAY) ×3 IMPLANT
PAD ARMBOARD 7.5X6 YLW CONV (MISCELLANEOUS) ×6 IMPLANT
PASSER SUT SWANSON 36MM LOOP (INSTRUMENTS) ×3 IMPLANT
PRESSURIZER FEMORAL UNIV (MISCELLANEOUS) IMPLANT
SET HNDPC FAN SPRY TIP SCT (DISPOSABLE) ×1 IMPLANT
SLEEVE SURGEON STRL (DRAPES) IMPLANT
SPONGE LAP 18X18 X RAY DECT (DISPOSABLE) IMPLANT
STAPLER VISISTAT 35W (STAPLE) ×3 IMPLANT
SUT ETHIBOND 2 V 37 (SUTURE) ×3 IMPLANT
SUT VIC AB 0 CTB1 27 (SUTURE) ×3 IMPLANT
SUT VIC AB 1 CTX 36 (SUTURE) ×2
SUT VIC AB 1 CTX36XBRD ANBCTR (SUTURE) ×1 IMPLANT
SUT VIC AB 2-0 CTB1 (SUTURE) ×3 IMPLANT
SUT VIC AB 3-0 CT1 27 (SUTURE) ×2
SUT VIC AB 3-0 CT1 TAPERPNT 27 (SUTURE) ×1 IMPLANT
SYR 20ML ECCENTRIC (SYRINGE) ×3 IMPLANT
SYR CONTROL 10ML LL (SYRINGE) ×3 IMPLANT
TOWEL OR 17X24 6PK STRL BLUE (TOWEL DISPOSABLE) ×3 IMPLANT
TOWEL OR 17X26 10 PK STRL BLUE (TOWEL DISPOSABLE) ×3 IMPLANT
TOWER CARTRIDGE SMART MIX (DISPOSABLE) IMPLANT
TRAY FOLEY CATH 14FR (SET/KITS/TRAYS/PACK) IMPLANT
TUBE ANAEROBIC SPECIMEN COL (MISCELLANEOUS) ×3 IMPLANT
WATER STERILE IRR 1000ML POUR (IV SOLUTION) ×9 IMPLANT

## 2014-12-29 NOTE — Transfer of Care (Signed)
Immediate Anesthesia Transfer of Care Note  Patient: Kendra Mathis  Procedure(s) Performed: Procedure(s): TOTAL HIP REVISION (Right)  Patient Location: PACU  Anesthesia Type:Spinal  Level of Consciousness: awake, alert  and oriented  Airway & Oxygen Therapy: Patient Spontanous Breathing and Patient connected to nasal cannula oxygen  Post-op Assessment: Report given to RN, Post -op Vital signs reviewed and stable and Patient moving all extremities X 4  Post vital signs: Reviewed and stable  Last Vitals:  Filed Vitals:   12/29/14 0716  BP: 166/74  Pulse: 65  Temp: 36.4 C  Resp: 20    Complications: No apparent anesthesia complications

## 2014-12-29 NOTE — Discharge Instructions (Signed)

## 2014-12-29 NOTE — Anesthesia Postprocedure Evaluation (Signed)
  Anesthesia Post-op Note  Patient: Kendra Mathis  Procedure(s) Performed: Procedure(s): TOTAL HIP REVISION (Right)  Patient Location: PACU  Anesthesia Type:MAC and Spinal  Level of Consciousness: awake, alert  and oriented  Airway and Oxygen Therapy: Patient Spontanous Breathing and Patient connected to nasal cannula oxygen  Post-op Pain: none  Post-op Assessment: Post-op Vital signs reviewed, Patient's Cardiovascular Status Stable, Respiratory Function Stable, Patent Airway, No signs of Nausea or vomiting and Pain level controlled LLE Motor Response: Responds to commands, Purposeful movement (able to raise knees slightly off the bed)   RLE Motor Response: Responds to commands, Purposeful movement (able to raise knees slightly off the bed)   L Sensory Level: S1-Sole of foot, small toes R Sensory Level: S1-Sole of foot, small toes  Post-op Vital Signs: stable  Last Vitals:  Filed Vitals:   12/29/14 1318  BP: 142/60  Pulse: 74  Temp:   Resp: 18    Complications: No apparent anesthesia complications

## 2014-12-29 NOTE — Op Note (Signed)
Preop diagnosis: Worn polyethylene liner right total hip, near metal on metal  Postoperative diagnosis: Same  Procedure: Revision right total hip arthroplasty with removal of acetabular liner and placement of a new Stryker 48 mm polyethylene liner 10 slope index posterior superior and a new 28 mm ceramic femoral head component, with sleeve  Surgeon: Feliberto Gottron. Turner Daniels M.D.  Assistant: Tomi Likens. Gaylene Brooks  (present throughout entire procedure and necessary for timely completion of the procedure)  Estimated blood loss: 200 cc  Fluid replacement: 1500 cc of crystalloid  Complications: None  Specimens: none  Indications: Patient with impressive wear of the 26 mm polyethylene liner that was placed in 1998, superior migration of the 28 mm metal head to the point where it is almost eroding into the titanium shell. Radiographs show good geometry of the acetabular and femoral components. No history of fevers chills or wound problems. Risks and benefits of revision of the liner have been discussed with the patient we will also have to revise the femoral head component if either the shell of the stem are loose we will also be revised.  Procedure: Patient was identified by arm band receive preoperative IV antibiotics in the holding area at, [facility] hospital. Taken to the operating room where the appropriate anesthetic monitors were attached and general endotracheal anesthesia induced with the patient in the supine position. Rolled into the L lateral decubitus position and fixed there with a mark 2 pelvic clamp. The limb prepped and draped in usual sterile fashion from the ankle to the hemipelvis. Time out procedure performed. Skin along the lateral hip and thigh infiltrated with 10 cc of 1/2% Marcaine and epinephrine solution. We began the operation by recreating the old posterior lateral incision 15 cm in line through the skin and subcutaneous tissue down to the level of the IT band which was cut in line with  the skin incision. This exposed the greater trochanter, and the posterior capsule which was entered yielding clear fluid there was from the fibrous material consistent with polyethylene disease in the soft tissue and this was resected. There was no evidence of erosion of the bone of the acetabular component or femoral component. The trunnion was tucked superiorly and anteriorly to the acetabular component, and the polyethylene liner was removed using a 4.5 mm screw that lifted the liner out of the shell.. We then carefully cleared around the edges of the acetabular component in preparation for the new 48 mm liner to except a 28 mm head index posterior superior. liner and confirm there was good bone ingrowth. The new liner was then hammered into the acetabular shell and locked into place with locking ring. On the femoral side a +0 28 mm ceramic head with a C taper sleeve was hammered onto the stem, the hip was reduced. Stability was checked and found to be excellent. The wound is irrigated out normal saline solution. The pseudocapsule was repaired back to the intertrochanteric crest using 3 #2 Ethibond sutures through drill holes. We then closed the IT band with running #1 Vicryl suture, the subcutaneous tissue with 0 and 2-0 undyed Vicryl suture, the skin was closed with running interlocking 3-0 nylon suture. A dressing of Aquacil was then applied, the patient was unclamped a rolled supine awakened extubated and taken to the recovery without difficulty.

## 2014-12-29 NOTE — Evaluation (Signed)
Physical Therapy Evaluation Patient Details Name: Kendra Mathis MRN: [id number] DOB: January 18, 1951 Today's Date: 12/29/2014   History of Present Illness  Revision right total hip arthroplasty with removal of acetabular liner and placement of ceramic femoral head component. PMH: back pain, osteopenia  Clinical Impression  Pt is s/p THA resulting in the deficits listed below (see PT Problem List). Pt will benefit from skilled PT to increase their independence and safety with mobility to allow discharge to home with family assistance. Patient limited during session due to lethargy but doing well with initial mobilization. Encouraging patient to get up with nursing assistance later tonight. Did review posterior hip precautions with patient and spouse but will need to be reviewed. Precautions posted in room. Will continue to follow to progress mobility and independence.       Follow Up Recommendations Home health PT;Supervision for mobility/OOB    Equipment Recommendations  Rolling walker with 5" wheels    Recommendations for Other Services OT consult     Precautions / Restrictions Precautions Precautions: Posterior Hip;Fall Precaution Booklet Issued: Yes (comment) Precaution Comments: HEP and posterior precautions Restrictions Weight Bearing Restrictions: Yes RLE Weight Bearing: Weight bearing as tolerated      Mobility  Bed Mobility Overal bed mobility: Needs Assistance Bed Mobility: Supine to Sit     Supine to sit: Min assist (Rt LE, trunk with initial sitting)     General bed mobility comments: patient drowsy, assist at trunk with initial sitting.   Transfers Overall transfer level: Needs assistance Equipment used: Rolling walker (2 wheeled) Transfers: Sit to/from Stand Sit to Stand: Min assist         General transfer comment: min assist with stand and for balance initially  Ambulation/Gait Ambulation/Gait assistance: Min guard Ambulation Distance (Feet): 6  Feet Assistive device: Rolling walker (2 wheeled) Gait Pattern/deviations: Step-to pattern;Decreased weight shift to right Gait velocity: decreased   General Gait Details: cues for safety regarding lines and use of rw.   Stairs            Wheelchair Mobility    Modified Rankin (Stroke Patients Only)       Balance Overall balance assessment: Needs assistance Sitting-balance support: Single extremity supported Sitting balance-Leahy Scale: Poor     Standing balance support: Bilateral upper extremity supported Standing balance-Leahy Scale: Poor Standing balance comment: using rw and support                             Pertinent Vitals/Pain Pain Assessment: Faces Faces Pain Scale: Hurts little more Pain Location: Rt hip Pain Descriptors / Indicators: Guarding;Grimacing Pain Intervention(s): Limited activity within patient's tolerance;Monitored during session;Repositioned;Ice applied    Home Living Family/patient expects to be discharged to:: Private residence Living Arrangements: Spouse/significant other Available Help at Discharge: Available 24 hours/day;Family Type of Home: House Home Access: Stairs to enter Entrance Stairs-Rails: None Entrance Stairs-Number of Steps: 2 Home Layout: Able to live on main level with bedroom/bathroom Home Equipment: None      Prior Function Level of Independence: Independent               Hand Dominance        Extremity/Trunk Assessment   Upper Extremity Assessment: Defer to OT evaluation           Lower Extremity Assessment: RLE deficits/detail RLE Deficits / Details: able to bear partial weight with ambulation       Communication   Communication: No difficulties  Cognition Arousal/Alertness: Lethargic Behavior During Therapy: Flat affect Overall Cognitive Status: Within Functional Limits for tasks assessed                      General Comments General comments (skin integrity,  edema, etc.): Patient very drowsy during session but able to particiapte. Patient not approprate for sitting up in chair at this time but encouraged patient and spouse to call nursing to assist to chair later tonight.     Exercises        Assessment/Plan    PT Assessment Patient needs continued PT services  PT Diagnosis Difficulty walking   PT Problem List Decreased strength;Decreased range of motion;Decreased activity tolerance;Decreased balance;Decreased mobility;Decreased knowledge of use of DME;Decreased safety awareness  PT Treatment Interventions DME instruction;Gait training;Stair training;Functional mobility training;Therapeutic activities;Therapeutic exercise;Balance training;Patient/family education   PT Goals (Current goals can be found in the Care Plan section) Acute Rehab PT Goals Patient Stated Goal: go home up D/C PT Goal Formulation: With patient/family Time For Goal Achievement: 01/12/15 Potential to Achieve Goals: Good    Frequency 7X/week   Barriers to discharge        Co-evaluation               End of Session Equipment Utilized During Treatment: Gait belt Activity Tolerance: Patient limited by lethargy Patient left: in bed;with call bell/phone within reach;with family/visitor present;Other (comment) (O2 on) Nurse Communication: Mobility status;Precautions;Weight bearing status         Time: 1404-1430 PT Time Calculation (min) (ACUTE ONLY): 26 min   Charges:   PT Evaluation $Initial PT Evaluation Tier I: 1 Procedure PT Treatments $Therapeutic Activity: 8-22 mins   PT G Codes:        Christiane Ha, PT, CSCS Pager [phone] Office [phone]  12/29/2014, 3:47 PM

## 2014-12-29 NOTE — Anesthesia Preprocedure Evaluation (Signed)
Anesthesia Evaluation  Patient identified by MRN, date of birth, ID band Patient awake    Reviewed: Allergy & Precautions, NPO status , Patient's Chart, lab work & pertinent test results  Airway Mallampati: II  TM Distance: >3 FB Neck ROM: Full    Dental  (+) Teeth Intact, Dental Advisory Given   Pulmonary    breath sounds clear to auscultation       Cardiovascular hypertension,  Rhythm:Regular Rate:Normal     Neuro/Psych    GI/Hepatic   Endo/Other    Renal/GU      Musculoskeletal   Abdominal   Peds  Hematology   Anesthesia Other Findings   Reproductive/Obstetrics                             Anesthesia Physical Anesthesia Plan  ASA: II  Anesthesia Plan: MAC and Spinal   Post-op Pain Management: MAC Combined w/ Regional for Post-op pain   Induction: Intravenous  Airway Management Planned: Natural Airway and Simple Face Mask  Additional Equipment:   Intra-op Plan:   Post-operative Plan:   Informed Consent: I have reviewed the patients History and Physical, chart, labs and discussed the procedure including the risks, benefits and alternatives for the proposed anesthesia with the patient or authorized representative who has indicated his/her understanding and acceptance.   Dental advisory given  Plan Discussed with: CRNA and Anesthesiologist  Anesthesia Plan Comments: (Hypertension\ S/P R. THR needs revision  Plan SAB  Kipp Brood)        Anesthesia Quick Evaluation

## 2014-12-29 NOTE — Care Management Note (Signed)
Case Management Note  Patient Details  Name: Kendra Mathis MRN: [id number] Date of Birth: 07/06/1950  Subjective/Objective:  64 yr old female s/p right total hip arthroplasty                  Action/Plan: Case manager spoke with patient and husband concerning home health and DME needs at discharge. Patient was preoperatively setup with [facility], no changes. Patient will have family support at discharge.   Expected Discharge Date:    12/31/14              Expected Discharge Plan:  Home w Home Health Services  In-House Referral:  NA  Discharge planning Services  CM Consult  Post Acute Care Choice:  Durable Medical Equipment, Home Health Choice offered to:  Patient  DME Arranged:  3-N-1, Walker rolling DME Agency:     HH Arranged:  PT HH Agency:  [facility]  Status of Service:  In process, will continue to follow  Medicare Important Message Given:    Date Medicare IM Given:    Medicare IM give by:    Date Additional Medicare IM Given:    Additional Medicare Important Message give by:     If discussed at Long Length of Stay Meetings, dates discussed:    Additional Comments:  Marrianne, Sica, RN 12/29/2014, 4:12 PM

## 2014-12-29 NOTE — Anesthesia Procedure Notes (Signed)
Procedure Name: MAC Date/Time: 12/29/2014 9:36 AM Performed by: Carmela Rima Pre-anesthesia Checklist: Patient identified, Emergency Drugs available, Suction available, Patient being monitored and Timeout performed Oxygen Delivery Method: Nasal cannula Placement Confirmation: positive ETCO2 Dental Injury: Teeth and Oropharynx as per pre-operative assessment

## 2014-12-29 NOTE — Progress Notes (Signed)
Utilization review completed.  

## 2014-12-30 ENCOUNTER — Encounter (HOSPITAL_COMMUNITY): Payer: Self-pay | Admitting: Orthopedic Surgery

## 2014-12-30 LAB — CBC
HEMATOCRIT: 33.1 % — AB (ref 36.0–46.0)
Hemoglobin: 10.9 g/dL — ABNORMAL LOW (ref 12.0–15.0)
MCH: 30 pg (ref 26.0–34.0)
MCHC: 32.9 g/dL (ref 30.0–36.0)
MCV: 91.2 fL (ref 78.0–100.0)
Platelets: 221 10*3/uL (ref 150–400)
RBC: 3.63 MIL/uL — AB (ref 3.87–5.11)
RDW: 14.6 % (ref 11.5–15.5)
WBC: 7.8 10*3/uL (ref 4.0–10.5)

## 2014-12-30 MED ORDER — IBUPROFEN 200 MG PO TABS
800.0000 mg | ORAL_TABLET | Freq: Three times a day (TID) | ORAL | Status: DC | PRN
  Administered 2014-12-30: 800 mg via ORAL
  Filled 2014-12-30: qty 4

## 2014-12-30 MED ORDER — OXYCODONE-ACETAMINOPHEN 5-325 MG PO TABS: 1.0000 | ORAL_TABLET | ORAL | Status: DC | PRN

## 2014-12-30 MED ORDER — ASPIRIN EC 325 MG PO TBEC: 325.0000 mg | DELAYED_RELEASE_TABLET | Freq: Two times a day (BID) | ORAL | Status: DC

## 2014-12-30 MED ORDER — TIZANIDINE HCL 2 MG PO TABS: 2.0000 mg | ORAL_TABLET | Freq: Four times a day (QID) | ORAL | Status: DC | PRN

## 2014-12-30 NOTE — Progress Notes (Signed)
Patient ID: Kendra Mathis, female   DOB: 13-Jul-1950, 64 y.o.   MRN: [id number] PATIENT ID: Kendra Mathis  MRN: [id number]  DOB/AGE:  1951-03-17 / 64 y.o.  1 Day Post-Op Procedure(s) (LRB): TOTAL HIP REVISION (Right)    PROGRESS NOTE Subjective: Patient is alert, oriented, x2 Nausea, x2 Vomiting, yes passing gas, no Bowel Movement. Taking PO well. Denies SOB, Chest or Calf Pain. Using Incentive Spirometer, PAS in place. Ambulate WBAT Patient reports pain as 2 on 0-10 scale  .    Objective: Vital signs in last 24 hours: Filed Vitals:   12/29/14 1318 12/29/14 2032 12/30/14 0132 12/30/14 0556  BP: 142/60 136/60 111/54 132/65  Pulse: 74 73 69 71  Temp:  98.3 F (36.8 C) 98.3 F (36.8 C) 97.9 F (36.6 C)  TempSrc:  Oral Oral Oral  Resp: Weight:      SpO2: 100% 100% 98% 97%      Intake/Output from previous day: I/O last 3 completed shifts: In: 1733.8 [P.O.:240; I.V.:1493.8] Out: 303 [Urine:1; Emesis/NG output:2; Blood:300]   Intake/Output this shift:     LABORATORY DATA:  Recent Labs  12/30/14 0527  WBC 7.8  HGB 10.9*  HCT 33.1*  PLT 221    Examination: Neurologically intact ABD soft Neurovascular intact Sensation intact distally Intact pulses distally Dorsiflexion/Plantar flexion intact Incision: dressing C/D/I No cellulitis present Compartment soft} XR AP&Lat of hip shows well placed\fixed THA  Assessment:   1 Day Post-Op Procedure(s) (LRB): TOTAL HIP REVISION (Right) ADDITIONAL DIAGNOSIS:  Expected Acute Blood Loss Anemia,   Plan: PT/OT WBAT, THA  posterior precautions  DVT Prophylaxis: SCDx72 hrs, ASA 325 mg BID x 2 weeks  DISCHARGE PLAN: Home  DISCHARGE NEEDS: HHPT, Walker and 3-in-1 comode seat

## 2014-12-30 NOTE — Evaluation (Signed)
Occupational Therapy Evaluation Patient Details Name: Kendra Mathis MRN: [id number] DOB: March 22, 1950 Today's Date: 12/30/2014    History of Present Illness Revision right total hip arthroplasty with removal of acetabular liner and placement of ceramic femoral head component. PMH: back pain, osteopenia   Clinical Impression   Pt reports she was independent with ADLs PTA. Currently pt is min assist for LB ADLs and supervision for functional mobility and UB ADLs. Pt able to state 2/3 posterior hip precautions, reviewed precautions. All education complete, pt verbalizes understanding. Pt with no further questions or concerns for OT at this time. Pt to d/c home with 24/7 supervision from family. Pt ready to d/c from an OT standpoint, will sign off at this time. Thank you for this referral.     Follow Up Recommendations  No OT follow up;Supervision - Intermittent    Equipment Recommendations  3 in 1 bedside comode    Recommendations for Other Services       Precautions / Restrictions Precautions Precautions: Posterior Hip;Fall Precaution Booklet Issued: Yes (comment) Precaution Comments: Pt able to state 2/3 precautions. Reviewed precautions Restrictions Weight Bearing Restrictions: Yes RLE Weight Bearing: Weight bearing as tolerated      Mobility Bed Mobility Overal bed mobility: Needs Assistance Bed Mobility: Supine to Sit     Supine to sit: Supervision     General bed mobility comments: Pt in recliner, returned to recliner at end of session  Transfers Overall transfer level: Needs assistance Equipment used: Rolling walker (2 wheeled) Transfers: Sit to/from Stand Sit to Stand: Supervision         General transfer comment: Verbal cues for hand placement, pt attempted to get up before RW was in front of her    Balance Overall balance assessment: Needs assistance Sitting-balance support: No upper extremity supported Sitting balance-Leahy Scale: Normal     Standing  balance support: Bilateral upper extremity supported Standing balance-Leahy Scale: Fair Standing balance comment: RW for support                            ADL Overall ADL's : Needs assistance/impaired Eating/Feeding: Set up;Sitting   Grooming: Supervision/safety;Standing   Upper Body Bathing: Set up;Sitting   Lower Body Bathing: Minimal assistance;Sit to/from stand   Upper Body Dressing : Set up;Sitting   Lower Body Dressing: Minimal assistance;Sit to/from stand Lower Body Dressing Details (indicate cue type and reason): Pt unable to don R sock  Toilet Transfer: Supervision/safety;Ambulation   Toileting- Clothing Manipulation and Hygiene: Supervision/safety;Sit to/from stand   Tub/ Shower Transfer: Supervision/safety;Ambulation;Shower seat;Rolling walker   Functional mobility during ADLs: Supervision/safety;Rolling walker General ADL Comments: No family present during OT eval. Educated pt on compensatory strategies for LB ADLs, use of AE (reacher) for LB dressing, need for supervision during ADLs and mobility for safety, reviewed precautions, use of 3 in 1 over toilet, walk in shower transfer technique; pt verbalized understanding. Discussed other AE for independence with LB ADLs, pt declined stating that husband or her sister could help initially until she is able to be more independent. Pt reports that she took a shower and got dressed this morning with min A from her husband.      Vision     Perception     Praxis      Pertinent Vitals/Pain Pain Assessment: 0-10 Pain Score: 2  Pain Location: R hip Pain Descriptors / Indicators: Aching;Sore Pain Intervention(s): Limited activity within patient's tolerance;Monitored during session;Repositioned  Hand Dominance Right   Extremity/Trunk Assessment Upper Extremity Assessment Upper Extremity Assessment: Overall WFL for tasks assessed   Lower Extremity Assessment Lower Extremity Assessment: Defer to PT  evaluation   Cervical / Trunk Assessment Cervical / Trunk Assessment: Normal   Communication Communication Communication: No difficulties   Cognition Arousal/Alertness: Awake/alert Behavior During Therapy: WFL for tasks assessed/performed Overall Cognitive Status: Within Functional Limits for tasks assessed       Memory: Decreased recall of precautions             General Comments       Exercises Exercises: Total Joint     Shoulder Instructions      Home Living Family/patient expects to be discharged to:: Private residence Living Arrangements: Spouse/significant other Available Help at Discharge: Available 24 hours/day;Family Type of Home: House Home Access: Stairs to enter [employer] of Steps: 2 Entrance Stairs-Rails: None Home Layout: Able to live on main level with bedroom/bathroom     Bathroom Shower/Tub: Producer, television/film/video: Handicapped height Bathroom Accessibility: Yes How Accessible: Accessible via walker Home Equipment: Shower seat - built in;Grab bars - tub/shower;Adaptive equipment Adaptive Equipment: Reacher        Prior Functioning/Environment Level of Independence: Independent             OT Diagnosis: Generalized weakness;Acute pain   OT Problem List:     OT Treatment/Interventions:      OT Goals(Current goals can be found in the care plan section) Acute Rehab OT Goals Patient Stated Goal: go home today OT Goal Formulation: With patient  OT Frequency:     Barriers to D/C:            Co-evaluation              End of Session Equipment Utilized During Treatment: Gait belt;Rolling walker  Activity Tolerance: Patient tolerated treatment well Patient left: in chair;with call bell/phone within reach;with nursing/sitter in room   Time: 1021-1038 OT Time Calculation (min): 17 min Charges:  OT General Charges $OT Visit: 1 Procedure OT Evaluation $Initial OT Evaluation Tier I: 1  Procedure G-Codes:     Gaye Alken M.S., OTR/L Pager: [phone]  12/30/2014, 10:48 AM

## 2014-12-30 NOTE — Plan of Care (Signed)
Problem: Consults Goal: Diagnosis- Total Joint Replacement Primary Total Hip     

## 2014-12-30 NOTE — Progress Notes (Addendum)
Physical Therapy Treatment Patient Details Name: Kendra Mathis MRN: [id number] DOB: [birth year] Today's Date: 12/30/2014    History of Present Illness Revision right total hip arthroplasty with removal of acetabular liner and placement of ceramic femoral head component. PMH: back pain, osteopenia    PT Comments    Patient is making good progress with PT.  From a mobility standpoint anticipate patient will be ready for DC home with family assistance. Patient denies any questions or concerns.       Follow Up Recommendations  Home health PT;Supervision for mobility/OOB     Equipment Recommendations  Rolling walker with 5" wheels    Recommendations for Other Services       Precautions / Restrictions Precautions Precautions: Posterior Hip Restrictions Weight Bearing Restrictions: Yes RLE Weight Bearing: Weight bearing as tolerated    Mobility  Bed Mobility               General bed mobility comments: up in chair upon arrival  Transfers Overall transfer level: Modified independent Equipment used: Rolling walker (2 wheeled) Transfers: Sit to/from Stand Sit to Stand: Modified independent (Device/Increase time)         General transfer comment: using rw  Ambulation/Gait Ambulation/Gait assistance: Modified independent (Device/Increase time) Ambulation Distance (Feet): 400 Feet Assistive device: Rolling walker (2 wheeled) Gait Pattern/deviations: Step-through pattern Gait velocity: WFL   General Gait Details: stable pattern, no loss of balance   Stairs Stairs: Yes Stairs assistance: Min assist Stair Management: No rails;Step to pattern Number of Stairs: 5 General stair comments: Hand held assistance up/down 5 steps, patient reports feeling confident, denies any questions or concerns.   Wheelchair Mobility    Modified Rankin (Stroke Patients Only)       Balance Overall balance assessment: Needs assistance Sitting-balance support: No upper extremity  supported Sitting balance-Leahy Scale: Normal     Standing balance support: During functional activity Standing balance-Leahy Scale: Fair                      Cognition Arousal/Alertness: Awake/alert Behavior During Therapy: WFL for tasks assessed/performed Overall Cognitive Status: Within Functional Limits for tasks assessed                      Exercises Total Joint Exercises Hip ABduction/ADduction: Strengthening;Right;10 reps;Standing Knee Flexion: Standing;10 reps;Right;Strengthening Marching in Standing: Strengthening;Right;10 reps Standing Hip Extension: Strengthening;Right;10 reps    General Comments        Pertinent Vitals/Pain Pain Assessment: 0-10 Pain Score: 2  Pain Location: Rt hip Pain Descriptors / Indicators: Sore Pain Intervention(s): Limited activity within patient's tolerance;Monitored during session    Home Living                      Prior Function            PT Goals (current goals can now be found in the care plan section) Acute Rehab PT Goals Patient Stated Goal: go home today PT Goal Formulation: With patient/family Time For Goal Achievement: 01/12/15 Potential to Achieve Goals: Good Progress towards PT goals: Progressing toward goals    Frequency  7X/week    PT Plan Current plan remains appropriate    Co-evaluation             End of Session Equipment Utilized During Treatment: Gait belt Activity Tolerance: Patient tolerated treatment well Patient left: in chair;with call bell/phone within reach;with family/visitor present     Time: 4098-1191 PT Time Calculation (  min) (ACUTE ONLY): 12 min  Charges:  $Gait Training: 8-22 mins                    G Codes:      Christiane Ha, PT, CSCS Pager [phone] Office [phone]  12/30/2014, 3:10 PM

## 2014-12-30 NOTE — Progress Notes (Signed)
Physical Therapy Treatment Patient Details Name: Kendra Mathis MRN: [id number] DOB: 10/25/1950 Today's Date: 12/30/2014    History of Present Illness Revision right total hip arthroplasty with removal of acetabular liner and placement of ceramic femoral head component. PMH: back pain, osteopenia    PT Comments    Patient is making good progress with PT.  From a mobility standpoint anticipate patient will be ready for DC home with assistance following second session for gait and stairs. Patient and spouse in agreement. Will continue to progress mobility and independence.      Follow Up Recommendations  Home health PT;Supervision for mobility/OOB     Equipment Recommendations  Rolling walker with 5" wheels    Recommendations for Other Services       Precautions / Restrictions Precautions Precautions: Posterior Hip;Fall Precaution Booklet Issued: Yes (comment) Precaution Comments:  (reviewed precautions) Restrictions Weight Bearing Restrictions: Yes RLE Weight Bearing: Weight bearing as tolerated    Mobility  Bed Mobility Overal bed mobility: Needs Assistance Bed Mobility: Supine to Sit     Supine to sit: Supervision     General bed mobility comments: consistent with precautions  Transfers Overall transfer level: Needs assistance Equipment used: Rolling walker (2 wheeled) Transfers: Sit to/from Stand Sit to Stand: Supervision         General transfer comment: reminder for hand placement  Ambulation/Gait Ambulation/Gait assistance: Supervision Ambulation Distance (Feet): 150 Feet Assistive device: Rolling walker (2 wheeled) Gait Pattern/deviations: Step-through pattern;Decreased weight shift to right;Decreased stance time - right Gait velocity: decreased   General Gait Details: stable pattern, no loss of balance   Stairs            Wheelchair Mobility    Modified Rankin (Stroke Patients Only)       Balance Overall balance assessment: Needs  assistance Sitting-balance support: No upper extremity supported Sitting balance-Leahy Scale: Normal     Standing balance support: During functional activity Standing balance-Leahy Scale: Fair Standing balance comment: using rw                    Cognition Arousal/Alertness: Awake/alert Behavior During Therapy: WFL for tasks assessed/performed Overall Cognitive Status: Within Functional Limits for tasks assessed                      Exercises Total Joint Exercises Ankle Circles/Pumps: AROM;Both;10 reps Quad Sets: Strengthening;Right;10 reps Short Arc Quad: Strengthening;Right;10 reps Heel Slides: Strengthening;Right;10 reps Hip ABduction/ADduction: Strengthening;Right;10 reps    General Comments        Pertinent Vitals/Pain Pain Assessment: 0-10 Pain Score: 2  Pain Location: Rt hip Pain Descriptors / Indicators: Sore Pain Intervention(s): Limited activity within patient's tolerance;Monitored during session    Home Living                      Prior Function            PT Goals (current goals can now be found in the care plan section) Acute Rehab PT Goals Patient Stated Goal: go home today PT Goal Formulation: With patient/family Time For Goal Achievement: 01/12/15 Potential to Achieve Goals: Good Progress towards PT goals: Progressing toward goals    Frequency  7X/week    PT Plan Current plan remains appropriate    Co-evaluation             End of Session Equipment Utilized During Treatment: Gait belt Activity Tolerance: Patient tolerated treatment well Patient left: in chair;with call bell/phone within  reach;with family/visitor present     Time: 0825-0852 PT Time Calculation (min) (ACUTE ONLY): 27 min  Charges:  $Gait Training: 8-22 mins $Therapeutic Exercise: 8-22 mins                    G Codes:      Christiane Ha, PT, CSCS Pager [phone] Office [phone]  12/30/2014, 8:59 AM

## 2014-12-30 NOTE — Discharge Summary (Signed)
Patient ID: Kendra Mathis MRN: [id number] DOB/AGE: 04-13-1950 64 y.o.  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 PONV (postoperative nausea and vomiting)   . Palpitations     takes Inderal daily  . Hypertension     takes Lisinopril-HCTZ daily  . Hyperlipidemia     hx of-tried Lovastatin but joint aches -has been off for several yrs   . Pleurisy     > 15 yrs ago  . Arthritis   . Back pain     scoliosis  . Osteopenia     takes Evista every other day  . History of colon polyps     benign  . Stress incontinence   . Urinary urgency   . Nocturia     Surgeries: Procedure(s): TOTAL HIP REVISION on 12/29/2014   Consultants:    Discharged Condition: Improved  Hospital Course: Kendra Mathis is an 64 y.o. female who was admitted 12/29/2014 for operative treatment ofWorn out joint prosthesis of hip (HCC). Patient has severe unremitting pain that affects sleep, daily activities, and work/hobbies. After pre-op clearance the patient was taken to the operating room on 12/29/2014 and underwent  Procedure(s): TOTAL HIP REVISION.    Patient was given perioperative antibiotics: Anti-infectives    Start     Dose/Rate Route Frequency Ordered Stop   12/29/14 0715  ceFAZolin (ANCEF) IVPB 2 g/50 mL premix     2 g 100 mL/hr over 30 Minutes Intravenous On call to O.R. 12/29/14 0715 12/29/14 7829       Patient was given sequential compression devices, early ambulation, and chemoprophylaxis to prevent DVT.  Patient benefited maximally from hospital stay and there were no complications.    Recent vital signs: Patient Vitals for the past 24 hrs:  BP Temp Temp src Pulse Resp SpO2  12/30/14 0556 132/65 mmHg 97.9 F (36.6 C) Oral 71 18 97 %  12/30/14 0132 (!) 111/54  mmHg 98.3 F (36.8 C) Oral 69 18 98 %  12/29/14 2032 136/60 mmHg 98.3 F (36.8 C) Oral 73 18 100 %     Recent laboratory studies:  Recent Labs  12/30/14 0527  WBC 7.8  HGB 10.9*  HCT 33.1*  PLT 221     Discharge Medications:     Medication List    TAKE these medications        aspirin EC 325 MG tablet  Take 1 tablet (325 mg total) by mouth 2 (two) times daily.     cholecalciferol 1000 UNITS tablet  Commonly known as:  VITAMIN D  Take 1,000 Units by mouth daily.     docusate sodium 100 MG capsule  Commonly known as:  COLACE  Take 100 mg by mouth daily as needed for mild constipation.     FLAXSEED OIL PO  Take by mouth.     lisinopril-hydrochlorothiazide 20-12.5 MG tablet  Commonly known as:  PRINZIDE,ZESTORETIC  Take 1 tablet by mouth daily.     naproxen sodium 220 MG tablet  Commonly known as:  ANAPROX  Take 220 mg by mouth 2 (two) times daily with a meal.     oxyCODONE-acetaminophen 5-325 MG tablet  Commonly known as:  ROXICET  Take 1 tablet by mouth every 4 (four) hours as needed for severe pain.     polyethylene  glycol packet  Commonly known as:  MIRALAX / GLYCOLAX  Take 17 g by mouth daily as needed.     propranolol 40 MG tablet  Commonly known as:  INDERAL  Take 40 mg by mouth 3 (three) times daily.     raloxifene 60 MG tablet  Commonly known as:  EVISTA  Take 60 mg by mouth daily.     tiZANidine 2 MG tablet  Commonly known as:  ZANAFLEX  Take 1 tablet (2 mg total) by mouth every 6 (six) hours as needed for muscle spasms.        Diagnostic Studies: Dg Chest 2 View  12/18/2014   CLINICAL DATA:  Preoperative examination prior total hip revision, history of hypertension, palpitations.  EXAM: CHEST  2 VIEW  COMPARISON:  None in PACs  FINDINGS: The lungs are mildly hyperinflated and clear. The heart and pulmonary vascularity are normal. The mediastinum is normal in width. The bony thorax exhibits no acute abnormality.  IMPRESSION: There is no  active cardiopulmonary disease.   Electronically Signed   By: David  [place] M.D.   On: 12/18/2014 13:47   Dg Pelvis Portable  12/29/2014   CLINICAL DATA:  Total hip arthroplasty revision  EXAM: PORTABLE PELVIS 1-2 VIEWS  COMPARISON:  None.  FINDINGS: Changes of right hip replacement. Normal AP alignment. No hardware or bony complicating feature.  IMPRESSION: Right hip replacement.  No complicating feature.   Electronically Signed   By: Charlett Nose M.D.   On: 12/29/2014 11:46    Disposition: Final discharge disposition not confirmed      Discharge Instructions    Call MD / Call 911    Complete by:  As directed   If you experience chest pain or shortness of breath, CALL 911 and be transported to the hospital emergency room.  If you develope a fever above 101 F, pus (white drainage) or increased drainage or redness at the wound, or calf pain, call your surgeon's office.     Change dressing    Complete by:  As directed   You may change your dressing on day 5, then change the dressing daily with sterile 4 x 4 inch gauze dressing and paper tape.  You may clean the incision with alcohol prior to redressing     Constipation Prevention    Complete by:  As directed   Drink plenty of fluids.  Prune juice may be helpful.  You may use a stool softener, such as Colace (over the counter) 100 mg twice a day.  Use MiraLax (over the counter) for constipation as needed.     Diet - low sodium heart healthy    Complete by:  As directed      Discharge instructions    Complete by:  As directed   Follow up in office with Dr. Turner Daniels in 2 weeks.     Driving restrictions    Complete by:  As directed   No driving for 2 weeks     Follow the hip precautions as taught in Physical Therapy    Complete by:  As directed      Increase activity slowly as tolerated    Complete by:  As directed      Patient may shower    Complete by:  As directed   You may shower without a dressing once there is no drainage.  Do not wash  over the wound.  If drainage remains, cover wound with plastic wrap and then shower.  Follow-up Information    Follow up with Nestor Lewandowsky, MD In 2 weeks.   Specialty:  Orthopedic Surgery   Contact information:   [address] [place] [address] [phone]       Follow up with [facility].   Why:  Someone from [facility] will contact you concerning start date and time for therapy.   Contact information:   [address] [place] [address] [phone]        Signed: Vear Clock, ERIC R 12/30/2014, 3:00 PM

## 2015-01-13 DIAGNOSIS — T84060D Wear of articular bearing surface of internal prosthetic right hip joint, subsequent encounter: Secondary | ICD-10-CM | POA: Diagnosis not present

## 2015-02-26 ENCOUNTER — Ambulatory Visit (INDEPENDENT_AMBULATORY_CARE_PROVIDER_SITE_OTHER): Payer: Medicare Other | Admitting: Podiatry

## 2015-02-26 ENCOUNTER — Telehealth: Payer: Self-pay | Admitting: *Deleted

## 2015-02-26 ENCOUNTER — Encounter: Payer: Self-pay | Admitting: Podiatry

## 2015-02-26 VITALS — BP 126/69 | HR 70 | Resp 12

## 2015-02-26 DIAGNOSIS — M779 Enthesopathy, unspecified: Secondary | ICD-10-CM

## 2015-02-26 MED ORDER — TRIAMCINOLONE ACETONIDE 10 MG/ML IJ SUSP
10.0000 mg | Freq: Once | INTRAMUSCULAR | Status: AC
  Administered 2015-02-26: 10 mg

## 2015-02-26 MED ORDER — NONFORMULARY OR COMPOUNDED ITEM: Status: DC

## 2015-02-26 NOTE — Telephone Encounter (Signed)
Dr. Regal ordered Antiinflammatory Cream from [facility]. 

## 2015-03-01 NOTE — Progress Notes (Signed)
Subjective:     Patient ID: Kendra Mathis, female   DOB: 30-May-1950, 64 y.o.   MRN: [id number]  HPI patient presents with continued pain on top of the right foot stating she had relief for a significant period of time but has had reoccurrence   Review of Systems     Objective:   Physical Exam Her vascular status intact muscle strength adequate with inflammation and pain on the dorsum of the left foot with fluid buildup around the midtarsal joint and extensor tendon complex    Assessment:     Inflammatory tendinitis secondary to probable osteoarthritis    Plan:     Reinjected the tendon sheath complex 3 mg Kenalog 5 mill grams Xylocaine dispensed topical medication to try to reduce the inflammatory process and advised on wider shoes. Reappoint to recheck

## 2015-03-04 DIAGNOSIS — Z1231 Encounter for screening mammogram for malignant neoplasm of breast: Secondary | ICD-10-CM | POA: Diagnosis not present

## 2015-03-04 DIAGNOSIS — Z803 Family history of malignant neoplasm of breast: Secondary | ICD-10-CM | POA: Diagnosis not present

## 2015-04-21 DIAGNOSIS — T84020A Dislocation of internal right hip prosthesis, initial encounter: Secondary | ICD-10-CM | POA: Diagnosis not present

## 2015-04-21 DIAGNOSIS — S73004A Unspecified dislocation of right hip, initial encounter: Secondary | ICD-10-CM | POA: Diagnosis not present

## 2015-04-21 DIAGNOSIS — M25559 Pain in unspecified hip: Secondary | ICD-10-CM | POA: Diagnosis not present

## 2015-04-21 DIAGNOSIS — Z9889 Other specified postprocedural states: Secondary | ICD-10-CM | POA: Diagnosis not present

## 2015-04-21 DIAGNOSIS — M25551 Pain in right hip: Secondary | ICD-10-CM | POA: Diagnosis not present

## 2015-04-21 DIAGNOSIS — T84029A Dislocation of unspecified internal joint prosthesis, initial encounter: Secondary | ICD-10-CM | POA: Diagnosis not present

## 2015-04-21 DIAGNOSIS — Z96641 Presence of right artificial hip joint: Secondary | ICD-10-CM | POA: Diagnosis not present

## 2015-04-21 DIAGNOSIS — I70219 Atherosclerosis of native arteries of extremities with intermittent claudication, unspecified extremity: Secondary | ICD-10-CM | POA: Diagnosis not present

## 2015-06-04 DIAGNOSIS — Z96641 Presence of right artificial hip joint: Secondary | ICD-10-CM | POA: Diagnosis not present

## 2015-06-04 DIAGNOSIS — S73006A Unspecified dislocation of unspecified hip, initial encounter: Secondary | ICD-10-CM | POA: Diagnosis not present

## 2015-06-04 DIAGNOSIS — T84020A Dislocation of internal right hip prosthesis, initial encounter: Secondary | ICD-10-CM | POA: Diagnosis not present

## 2015-06-04 DIAGNOSIS — Z79899 Other long term (current) drug therapy: Secondary | ICD-10-CM | POA: Diagnosis not present

## 2015-06-04 DIAGNOSIS — I10 Essential (primary) hypertension: Secondary | ICD-10-CM | POA: Diagnosis not present

## 2015-06-09 DIAGNOSIS — T84021D Dislocation of internal left hip prosthesis, subsequent encounter: Secondary | ICD-10-CM | POA: Diagnosis not present

## 2015-06-17 DIAGNOSIS — I70219 Atherosclerosis of native arteries of extremities with intermittent claudication, unspecified extremity: Secondary | ICD-10-CM | POA: Diagnosis not present

## 2015-06-17 DIAGNOSIS — Z0183 Encounter for blood typing: Secondary | ICD-10-CM | POA: Diagnosis not present

## 2015-06-17 DIAGNOSIS — Z01812 Encounter for preprocedural laboratory examination: Secondary | ICD-10-CM | POA: Diagnosis not present

## 2015-06-17 DIAGNOSIS — Z01818 Encounter for other preprocedural examination: Secondary | ICD-10-CM | POA: Diagnosis not present

## 2015-06-17 DIAGNOSIS — Z01810 Encounter for preprocedural cardiovascular examination: Secondary | ICD-10-CM | POA: Diagnosis not present

## 2015-06-22 DIAGNOSIS — T84022A Instability of internal right knee prosthesis, initial encounter: Secondary | ICD-10-CM | POA: Diagnosis not present

## 2015-06-22 DIAGNOSIS — I959 Hypotension, unspecified: Secondary | ICD-10-CM | POA: Diagnosis present

## 2015-06-22 DIAGNOSIS — E870 Hyperosmolality and hypernatremia: Secondary | ICD-10-CM | POA: Diagnosis present

## 2015-06-22 DIAGNOSIS — S76011A Strain of muscle, fascia and tendon of right hip, initial encounter: Secondary | ICD-10-CM | POA: Diagnosis present

## 2015-06-22 DIAGNOSIS — S76211A Strain of adductor muscle, fascia and tendon of right thigh, initial encounter: Secondary | ICD-10-CM | POA: Diagnosis not present

## 2015-06-22 DIAGNOSIS — Z96641 Presence of right artificial hip joint: Secondary | ICD-10-CM | POA: Diagnosis not present

## 2015-06-22 DIAGNOSIS — I10 Essential (primary) hypertension: Secondary | ICD-10-CM | POA: Diagnosis present

## 2015-06-22 DIAGNOSIS — E785 Hyperlipidemia, unspecified: Secondary | ICD-10-CM | POA: Diagnosis present

## 2015-06-22 DIAGNOSIS — E876 Hypokalemia: Secondary | ICD-10-CM | POA: Diagnosis present

## 2015-06-22 DIAGNOSIS — T84021A Dislocation of internal left hip prosthesis, initial encounter: Secondary | ICD-10-CM | POA: Diagnosis not present

## 2015-06-22 DIAGNOSIS — T84090A Other mechanical complication of internal right hip prosthesis, initial encounter: Secondary | ICD-10-CM | POA: Diagnosis not present

## 2015-06-22 DIAGNOSIS — Z471 Aftercare following joint replacement surgery: Secondary | ICD-10-CM | POA: Diagnosis not present

## 2015-06-22 DIAGNOSIS — T84020A Dislocation of internal right hip prosthesis, initial encounter: Secondary | ICD-10-CM | POA: Diagnosis present

## 2015-06-22 DIAGNOSIS — D62 Acute posthemorrhagic anemia: Secondary | ICD-10-CM | POA: Diagnosis not present

## 2015-06-22 DIAGNOSIS — M810 Age-related osteoporosis without current pathological fracture: Secondary | ICD-10-CM | POA: Diagnosis present

## 2015-06-24 DIAGNOSIS — Z79891 Long term (current) use of opiate analgesic: Secondary | ICD-10-CM | POA: Diagnosis not present

## 2015-06-24 DIAGNOSIS — Z96641 Presence of right artificial hip joint: Secondary | ICD-10-CM | POA: Diagnosis not present

## 2015-06-24 DIAGNOSIS — I10 Essential (primary) hypertension: Secondary | ICD-10-CM | POA: Diagnosis not present

## 2015-06-24 DIAGNOSIS — Z7982 Long term (current) use of aspirin: Secondary | ICD-10-CM | POA: Diagnosis not present

## 2015-06-24 DIAGNOSIS — E785 Hyperlipidemia, unspecified: Secondary | ICD-10-CM | POA: Diagnosis not present

## 2015-06-24 DIAGNOSIS — Z4732 Aftercare following explantation of hip joint prosthesis: Secondary | ICD-10-CM | POA: Diagnosis not present

## 2015-06-24 DIAGNOSIS — M810 Age-related osteoporosis without current pathological fracture: Secondary | ICD-10-CM | POA: Diagnosis not present

## 2015-06-24 DIAGNOSIS — M6281 Muscle weakness (generalized): Secondary | ICD-10-CM | POA: Diagnosis not present

## 2015-06-25 DIAGNOSIS — Z96641 Presence of right artificial hip joint: Secondary | ICD-10-CM | POA: Diagnosis not present

## 2015-06-25 DIAGNOSIS — Z4732 Aftercare following explantation of hip joint prosthesis: Secondary | ICD-10-CM | POA: Diagnosis not present

## 2015-06-25 DIAGNOSIS — M810 Age-related osteoporosis without current pathological fracture: Secondary | ICD-10-CM | POA: Diagnosis not present

## 2015-06-25 DIAGNOSIS — I10 Essential (primary) hypertension: Secondary | ICD-10-CM | POA: Diagnosis not present

## 2015-06-25 DIAGNOSIS — M6281 Muscle weakness (generalized): Secondary | ICD-10-CM | POA: Diagnosis not present

## 2015-06-25 DIAGNOSIS — E785 Hyperlipidemia, unspecified: Secondary | ICD-10-CM | POA: Diagnosis not present

## 2015-06-30 DIAGNOSIS — Z4732 Aftercare following explantation of hip joint prosthesis: Secondary | ICD-10-CM | POA: Diagnosis not present

## 2015-06-30 DIAGNOSIS — M6281 Muscle weakness (generalized): Secondary | ICD-10-CM | POA: Diagnosis not present

## 2015-06-30 DIAGNOSIS — Z96641 Presence of right artificial hip joint: Secondary | ICD-10-CM | POA: Diagnosis not present

## 2015-06-30 DIAGNOSIS — M810 Age-related osteoporosis without current pathological fracture: Secondary | ICD-10-CM | POA: Diagnosis not present

## 2015-06-30 DIAGNOSIS — I10 Essential (primary) hypertension: Secondary | ICD-10-CM | POA: Diagnosis not present

## 2015-06-30 DIAGNOSIS — E785 Hyperlipidemia, unspecified: Secondary | ICD-10-CM | POA: Diagnosis not present

## 2015-07-02 ENCOUNTER — Ambulatory Visit: Payer: Medicare Other | Admitting: Podiatry

## 2015-07-02 DIAGNOSIS — Z4732 Aftercare following explantation of hip joint prosthesis: Secondary | ICD-10-CM | POA: Diagnosis not present

## 2015-07-02 DIAGNOSIS — M810 Age-related osteoporosis without current pathological fracture: Secondary | ICD-10-CM | POA: Diagnosis not present

## 2015-07-02 DIAGNOSIS — M6281 Muscle weakness (generalized): Secondary | ICD-10-CM | POA: Diagnosis not present

## 2015-07-02 DIAGNOSIS — E785 Hyperlipidemia, unspecified: Secondary | ICD-10-CM | POA: Diagnosis not present

## 2015-07-02 DIAGNOSIS — Z96641 Presence of right artificial hip joint: Secondary | ICD-10-CM | POA: Diagnosis not present

## 2015-07-02 DIAGNOSIS — I10 Essential (primary) hypertension: Secondary | ICD-10-CM | POA: Diagnosis not present

## 2015-07-06 DIAGNOSIS — I10 Essential (primary) hypertension: Secondary | ICD-10-CM | POA: Diagnosis not present

## 2015-07-06 DIAGNOSIS — Z96641 Presence of right artificial hip joint: Secondary | ICD-10-CM | POA: Diagnosis not present

## 2015-07-06 DIAGNOSIS — M810 Age-related osteoporosis without current pathological fracture: Secondary | ICD-10-CM | POA: Diagnosis not present

## 2015-07-06 DIAGNOSIS — E785 Hyperlipidemia, unspecified: Secondary | ICD-10-CM | POA: Diagnosis not present

## 2015-07-06 DIAGNOSIS — M6281 Muscle weakness (generalized): Secondary | ICD-10-CM | POA: Diagnosis not present

## 2015-07-06 DIAGNOSIS — Z4732 Aftercare following explantation of hip joint prosthesis: Secondary | ICD-10-CM | POA: Diagnosis not present

## 2015-07-24 DIAGNOSIS — M25551 Pain in right hip: Secondary | ICD-10-CM | POA: Diagnosis not present

## 2015-07-24 DIAGNOSIS — Z96641 Presence of right artificial hip joint: Secondary | ICD-10-CM | POA: Diagnosis not present

## 2015-08-12 DIAGNOSIS — M259 Joint disorder, unspecified: Secondary | ICD-10-CM | POA: Diagnosis not present

## 2015-08-12 DIAGNOSIS — I10 Essential (primary) hypertension: Secondary | ICD-10-CM | POA: Diagnosis not present

## 2015-08-12 DIAGNOSIS — E785 Hyperlipidemia, unspecified: Secondary | ICD-10-CM | POA: Diagnosis not present

## 2015-08-12 DIAGNOSIS — F419 Anxiety disorder, unspecified: Secondary | ICD-10-CM | POA: Diagnosis not present

## 2015-08-19 ENCOUNTER — Ambulatory Visit (INDEPENDENT_AMBULATORY_CARE_PROVIDER_SITE_OTHER): Payer: Medicare Other | Admitting: Podiatry

## 2015-08-19 ENCOUNTER — Ambulatory Visit (INDEPENDENT_AMBULATORY_CARE_PROVIDER_SITE_OTHER): Payer: Medicare Other

## 2015-08-19 DIAGNOSIS — M779 Enthesopathy, unspecified: Secondary | ICD-10-CM

## 2015-08-19 MED ORDER — TRIAMCINOLONE ACETONIDE 10 MG/ML IJ SUSP
10.0000 mg | Freq: Once | INTRAMUSCULAR | Status: AC
  Administered 2015-08-19: 10 mg

## 2015-08-19 NOTE — Progress Notes (Signed)
Subjective:     Patient ID: Kendra Mathis, female   DOB: 07-03-1950, 65 y.o.   MRN: [id number]  HPI patient presents with pain in the dorsum of the right foot with inflammatory tendinitis of the midfoot noted   Review of Systems     Objective:   Physical Exam Neurovascular status intact with inflammatory tendinitis dorsum right foot with fluid buildup around the midtarsal joint    Assessment:     Tendinitis right foot with inflammation    Plan:     Injected the tendon complex right 3 mg Kenalog 5 mg Xylocaine and advised on physical therapy anti-inflammatories

## 2015-12-14 ENCOUNTER — Encounter: Payer: Self-pay | Admitting: Podiatry

## 2015-12-14 ENCOUNTER — Ambulatory Visit (INDEPENDENT_AMBULATORY_CARE_PROVIDER_SITE_OTHER): Payer: Medicare Other | Admitting: Podiatry

## 2015-12-14 DIAGNOSIS — M779 Enthesopathy, unspecified: Secondary | ICD-10-CM | POA: Diagnosis not present

## 2015-12-14 MED ORDER — TRIAMCINOLONE ACETONIDE 10 MG/ML IJ SUSP
10.0000 mg | Freq: Once | INTRAMUSCULAR | Status: AC
  Administered 2015-12-14: 10 mg

## 2015-12-16 NOTE — Progress Notes (Signed)
Subjective:     Patient ID: Shirlee LatchSusan Dawe, female   DOB: Mar 13, 1951, 65 y.o.   MRN: [id number]  HPI patient presents stating he's having pain in the outside of his left foot that sore and makes it hard to walk comfortably   Review of Systems     Objective:   Physical Exam Neurovascular status intact with tendinitis in the outside of the left foot with inflammation fluid buildup    Assessment:     Inflammatory tendinitis left    Plan:     Inject the left peroneal tendon group 3 mg Kenalog 5 mg Xylocaine and advised on ice therapy

## 2015-12-17 DIAGNOSIS — Z471 Aftercare following joint replacement surgery: Secondary | ICD-10-CM | POA: Diagnosis not present

## 2015-12-17 DIAGNOSIS — Z96641 Presence of right artificial hip joint: Secondary | ICD-10-CM | POA: Diagnosis not present

## 2015-12-25 DIAGNOSIS — H10413 Chronic giant papillary conjunctivitis, bilateral: Secondary | ICD-10-CM | POA: Diagnosis not present

## 2016-02-08 DIAGNOSIS — E785 Hyperlipidemia, unspecified: Secondary | ICD-10-CM | POA: Diagnosis not present

## 2016-02-08 DIAGNOSIS — F419 Anxiety disorder, unspecified: Secondary | ICD-10-CM | POA: Diagnosis not present

## 2016-02-08 DIAGNOSIS — M8580 Other specified disorders of bone density and structure, unspecified site: Secondary | ICD-10-CM | POA: Diagnosis not present

## 2016-02-08 DIAGNOSIS — I10 Essential (primary) hypertension: Secondary | ICD-10-CM | POA: Diagnosis not present

## 2016-02-08 DIAGNOSIS — R200 Anesthesia of skin: Secondary | ICD-10-CM | POA: Diagnosis not present

## 2016-02-08 DIAGNOSIS — Z23 Encounter for immunization: Secondary | ICD-10-CM | POA: Diagnosis not present

## 2016-02-08 DIAGNOSIS — H43812 Vitreous degeneration, left eye: Secondary | ICD-10-CM | POA: Diagnosis not present

## 2016-03-04 DIAGNOSIS — Z1231 Encounter for screening mammogram for malignant neoplasm of breast: Secondary | ICD-10-CM | POA: Diagnosis not present

## 2016-03-23 DIAGNOSIS — M79641 Pain in right hand: Secondary | ICD-10-CM | POA: Diagnosis not present

## 2016-03-23 DIAGNOSIS — R200 Anesthesia of skin: Secondary | ICD-10-CM | POA: Diagnosis not present

## 2016-03-23 DIAGNOSIS — G5601 Carpal tunnel syndrome, right upper limb: Secondary | ICD-10-CM | POA: Diagnosis not present

## 2016-04-26 ENCOUNTER — Other Ambulatory Visit: Payer: Self-pay | Admitting: Family Medicine

## 2016-04-26 ENCOUNTER — Other Ambulatory Visit (HOSPITAL_COMMUNITY)
Admission: RE | Admit: 2016-04-26 | Discharge: 2016-04-26 | Disposition: A | Discharge status: Home / Self Care | Payer: Medicare Other | Source: Ambulatory Visit | Attending: Family Medicine | Admitting: Family Medicine

## 2016-04-26 DIAGNOSIS — Z124 Encounter for screening for malignant neoplasm of cervix: Secondary | ICD-10-CM | POA: Diagnosis not present

## 2016-04-26 DIAGNOSIS — Z1159 Encounter for screening for other viral diseases: Secondary | ICD-10-CM | POA: Diagnosis not present

## 2016-04-26 DIAGNOSIS — I10 Essential (primary) hypertension: Secondary | ICD-10-CM | POA: Diagnosis not present

## 2016-04-26 DIAGNOSIS — E785 Hyperlipidemia, unspecified: Secondary | ICD-10-CM | POA: Diagnosis not present

## 2016-04-26 DIAGNOSIS — M545 Low back pain: Secondary | ICD-10-CM | POA: Diagnosis not present

## 2016-04-26 DIAGNOSIS — Z0001 Encounter for general adult medical examination with abnormal findings: Secondary | ICD-10-CM | POA: Diagnosis not present

## 2016-04-26 DIAGNOSIS — Z23 Encounter for immunization: Secondary | ICD-10-CM | POA: Diagnosis not present

## 2016-04-26 DIAGNOSIS — M8580 Other specified disorders of bone density and structure, unspecified site: Secondary | ICD-10-CM | POA: Diagnosis not present

## 2016-04-27 LAB — CYTOLOGY - PAP: Diagnosis: NEGATIVE

## 2016-05-06 ENCOUNTER — Encounter: Payer: Self-pay | Admitting: Podiatry

## 2016-05-06 ENCOUNTER — Ambulatory Visit (INDEPENDENT_AMBULATORY_CARE_PROVIDER_SITE_OTHER): Payer: Medicare Other | Admitting: Podiatry

## 2016-05-06 DIAGNOSIS — M7752 Other enthesopathy of left foot: Secondary | ICD-10-CM

## 2016-05-06 DIAGNOSIS — M779 Enthesopathy, unspecified: Secondary | ICD-10-CM

## 2016-05-06 MED ORDER — TRIAMCINOLONE ACETONIDE 10 MG/ML IJ SUSP
10.0000 mg | Freq: Once | INTRAMUSCULAR | Status: AC
  Administered 2016-05-06: 10 mg

## 2016-05-09 NOTE — Progress Notes (Signed)
Subjective:     Patient ID: Kendra Mathis, female   DOB: [birth year], 66 y.o.   MRN: [id number]  HPI patient presents stating that she had 5 months of complete relief and would like another injection on her left foot   Review of Systems     Objective:   Physical Exam Neurovascular status intact negative Homans sign was noted with pain found have midtarsal injection and pain left with inflammation around the midtarsal joint with pain left    Assessment:     Tendinitis with probable arthritis of the left midtarsal joint    Plan:     H&P condition reviewed and injected the midtarsal joint left 3 mg Kenalog 5 mg Xylocaine and advised on heat therapy and reappoint to recheck

## 2016-05-10 DIAGNOSIS — M4686 Other specified inflammatory spondylopathies, lumbar region: Secondary | ICD-10-CM | POA: Diagnosis not present

## 2016-05-10 DIAGNOSIS — G8929 Other chronic pain: Secondary | ICD-10-CM | POA: Diagnosis not present

## 2016-05-10 DIAGNOSIS — M545 Low back pain: Secondary | ICD-10-CM | POA: Diagnosis not present

## 2016-05-10 DIAGNOSIS — M419 Scoliosis, unspecified: Secondary | ICD-10-CM | POA: Diagnosis not present

## 2016-05-16 DIAGNOSIS — M8588 Other specified disorders of bone density and structure, other site: Secondary | ICD-10-CM | POA: Diagnosis not present

## 2016-05-17 DIAGNOSIS — M5136 Other intervertebral disc degeneration, lumbar region: Secondary | ICD-10-CM | POA: Diagnosis not present

## 2016-05-24 IMAGING — CR DG PORTABLE PELVIS
1 series · 1 of 1 positions shown · non-contrast
Comparison: None.

CLINICAL DATA: Total hip arthroplasty revision

EXAM:
PORTABLE PELVIS 1-2 VIEWS

[AP]
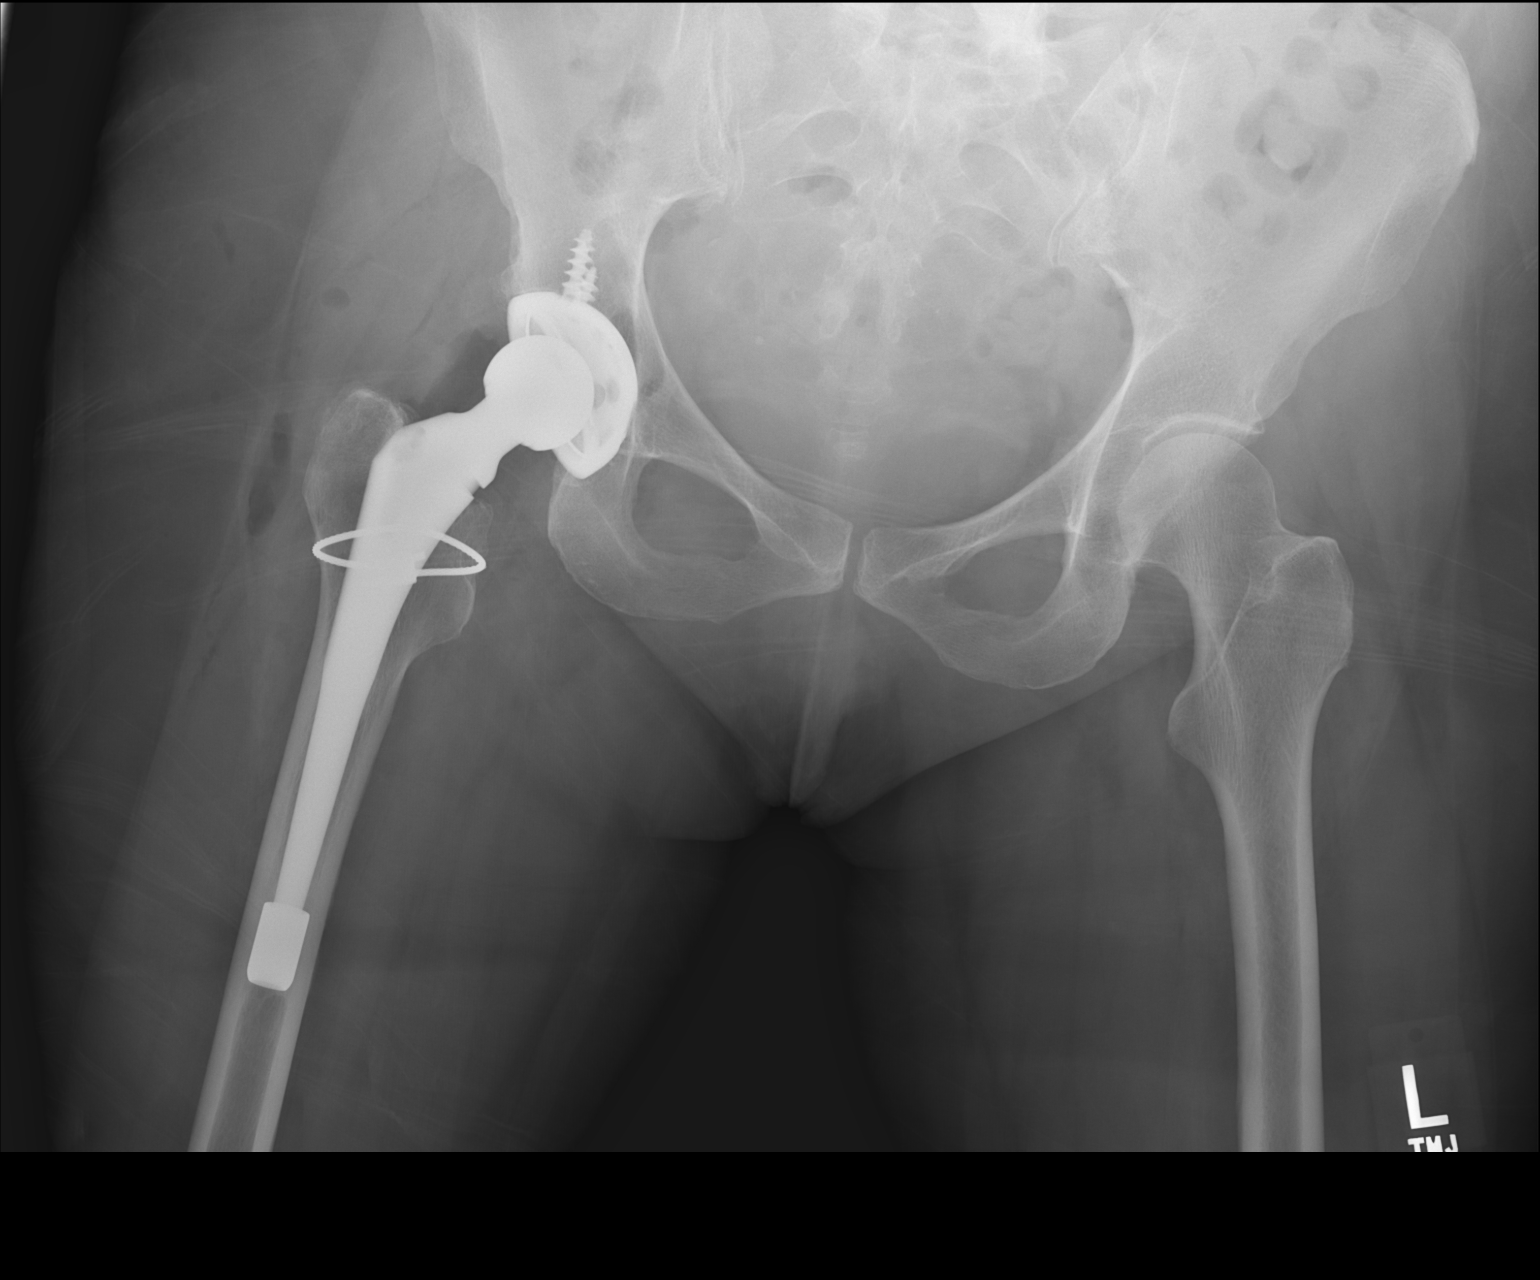

[1 of 1 positions shown; findings below may reference images not displayed]

FINDINGS: Changes of right hip replacement. Normal AP alignment. No hardware
or bony complicating feature.
IMPRESSION: Right hip replacement.  No complicating feature.

## 2016-07-01 DIAGNOSIS — Z96641 Presence of right artificial hip joint: Secondary | ICD-10-CM | POA: Diagnosis not present

## 2016-07-13 DIAGNOSIS — L718 Other rosacea: Secondary | ICD-10-CM | POA: Diagnosis not present

## 2016-09-01 ENCOUNTER — Encounter: Payer: Self-pay | Admitting: Podiatry

## 2016-09-01 ENCOUNTER — Ambulatory Visit (INDEPENDENT_AMBULATORY_CARE_PROVIDER_SITE_OTHER): Payer: Medicare Other | Admitting: Podiatry

## 2016-09-01 DIAGNOSIS — M779 Enthesopathy, unspecified: Secondary | ICD-10-CM | POA: Diagnosis not present

## 2016-09-01 MED ORDER — TRIAMCINOLONE ACETONIDE 10 MG/ML IJ SUSP
10.0000 mg | Freq: Once | INTRAMUSCULAR | Status: AC
  Administered 2016-09-01: 10 mg

## 2016-09-02 NOTE — Progress Notes (Signed)
Subjective:    Patient ID: Kendra Mathis, female   DOB: 66 y.o.   MRN: [id number]   HPI patient states she is developing pain again on top of her left foot and the symptoms are just starting to become bothersome and she is due to go on vacation    ROS      Objective:  Physical Exam neurovascular status intact with inflammation pain on the dorsum of the left foot midtarsal joint     Assessment:    Dorsal tendinitis left     Plan:   Advised on physical therapy anti-inflammatories and discussed surgical shoe usage. Patient had injection of 3 mg Kenalog 5 mill grams Xylocaine administered and was advised on heat ice therapy

## 2016-09-13 ENCOUNTER — Telehealth: Payer: Self-pay | Admitting: *Deleted

## 2016-09-13 MED ORDER — NONFORMULARY OR COMPOUNDED ITEM: 5 refills | Status: DC

## 2016-09-13 NOTE — Telephone Encounter (Signed)
Pt requested refill of the compound prescribed by Dr. Charlsie Merlesegal from [facility]. Dr. Everlena Cooperegal okayed [facility] Left message informing pt of the refills and to call with concerns. [facility]

## 2016-09-30 ENCOUNTER — Telehealth: Payer: Self-pay | Admitting: *Deleted

## 2016-09-30 MED ORDER — NONFORMULARY OR COMPOUNDED ITEM: 2 refills | Status: DC

## 2016-09-30 NOTE — Telephone Encounter (Signed)
Dr. Everlena Cooperegal okayed refill of [facility] Authorized Substitute Pain Cream +2 refills.

## 2016-11-07 DIAGNOSIS — E785 Hyperlipidemia, unspecified: Secondary | ICD-10-CM | POA: Diagnosis not present

## 2016-11-07 DIAGNOSIS — I10 Essential (primary) hypertension: Secondary | ICD-10-CM | POA: Diagnosis not present

## 2016-11-07 DIAGNOSIS — F419 Anxiety disorder, unspecified: Secondary | ICD-10-CM | POA: Diagnosis not present

## 2016-11-07 DIAGNOSIS — M8580 Other specified disorders of bone density and structure, unspecified site: Secondary | ICD-10-CM | POA: Diagnosis not present

## 2016-11-30 ENCOUNTER — Ambulatory Visit (INDEPENDENT_AMBULATORY_CARE_PROVIDER_SITE_OTHER): Payer: Medicare Other | Admitting: Podiatry

## 2016-11-30 ENCOUNTER — Encounter: Payer: Self-pay | Admitting: Podiatry

## 2016-11-30 DIAGNOSIS — B351 Tinea unguium: Secondary | ICD-10-CM | POA: Diagnosis not present

## 2016-11-30 DIAGNOSIS — M779 Enthesopathy, unspecified: Secondary | ICD-10-CM | POA: Diagnosis not present

## 2016-11-30 MED ORDER — TRIAMCINOLONE ACETONIDE 10 MG/ML IJ SUSP
10.0000 mg | Freq: Once | INTRAMUSCULAR | Status: AC
  Administered 2016-11-30: 10 mg

## 2016-11-30 NOTE — Progress Notes (Signed)
Subjective:    Patient ID: Shirlee LatchSusan Tom, female   DOB: 66 y.o.   MRN: [id number]   HPI patient presents with discomfort on top of the left foot and also concerns about toenails right foot 1 nail left foot that are discolored and she's not sure trauma with    ROS      Objective:  Physical Exam neurovascular status intact with patient found to have 2 separate problems one being inflammation tendinitis the midtarsal joint left and secondarily what appears to be damage nailbeds of the second and third right second left within nails with minimal growth     Assessment:    Tendinitis dorsal left with possibility for mycotic or damage nailbeds right and left     Plan:    H&P both conditions discussed and at this point I recommended no treatment for the nails allowing them to grow out with continued topical and I injected the left dorsal tendon complex 3 mg Kenalog 5 mg Xylocaine which was tolerated well

## 2016-12-15 DIAGNOSIS — M25552 Pain in left hip: Secondary | ICD-10-CM | POA: Diagnosis not present

## 2016-12-15 DIAGNOSIS — M7581 Other shoulder lesions, right shoulder: Secondary | ICD-10-CM | POA: Diagnosis not present

## 2016-12-15 DIAGNOSIS — M25551 Pain in right hip: Secondary | ICD-10-CM | POA: Diagnosis not present

## 2016-12-15 DIAGNOSIS — S83242A Other tear of medial meniscus, current injury, left knee, initial encounter: Secondary | ICD-10-CM | POA: Diagnosis not present

## 2017-01-12 DIAGNOSIS — J209 Acute bronchitis, unspecified: Secondary | ICD-10-CM | POA: Diagnosis not present

## 2017-01-12 DIAGNOSIS — H1012 Acute atopic conjunctivitis, left eye: Secondary | ICD-10-CM | POA: Diagnosis not present

## 2017-01-14 DIAGNOSIS — H1012 Acute atopic conjunctivitis, left eye: Secondary | ICD-10-CM | POA: Diagnosis not present

## 2017-01-14 DIAGNOSIS — J069 Acute upper respiratory infection, unspecified: Secondary | ICD-10-CM | POA: Diagnosis not present

## 2017-01-16 DIAGNOSIS — H6982 Other specified disorders of Eustachian tube, left ear: Secondary | ICD-10-CM | POA: Diagnosis not present

## 2017-01-16 DIAGNOSIS — J0100 Acute maxillary sinusitis, unspecified: Secondary | ICD-10-CM | POA: Diagnosis not present

## 2017-02-03 DIAGNOSIS — Z23 Encounter for immunization: Secondary | ICD-10-CM | POA: Diagnosis not present

## 2017-03-16 DIAGNOSIS — Z803 Family history of malignant neoplasm of breast: Secondary | ICD-10-CM | POA: Diagnosis not present

## 2017-03-16 DIAGNOSIS — Z1231 Encounter for screening mammogram for malignant neoplasm of breast: Secondary | ICD-10-CM | POA: Diagnosis not present

## 2017-03-22 ENCOUNTER — Encounter: Payer: Self-pay | Admitting: Podiatry

## 2017-03-22 ENCOUNTER — Ambulatory Visit (INDEPENDENT_AMBULATORY_CARE_PROVIDER_SITE_OTHER): Payer: Medicare Other | Admitting: Podiatry

## 2017-03-22 DIAGNOSIS — M2042 Other hammer toe(s) (acquired), left foot: Secondary | ICD-10-CM | POA: Diagnosis not present

## 2017-03-22 DIAGNOSIS — M779 Enthesopathy, unspecified: Secondary | ICD-10-CM | POA: Diagnosis not present

## 2017-03-22 MED ORDER — TRIAMCINOLONE ACETONIDE 10 MG/ML IJ SUSP
10.0000 mg | Freq: Once | INTRAMUSCULAR | Status: AC
  Administered 2017-03-22: 10 mg

## 2017-03-22 NOTE — Progress Notes (Signed)
Subjective:   Patient ID: Kendra Mathis, female   DOB: 67 y.o.   MRN: [id number]   HPI Patient presents with discomfort in the dorsum of the left foot with inflammation of the midtarsal joint and hammertoe deformity with keratotic lesion fifth left   ROS      Objective:  Physical Exam  Neurovascular status intact with dorsal tendinitis left with digital deformity fifth left with keratotic lesion that is painful when palpated     Assessment:  Tendinitis with hammertoe deformity and keratotic lesion     Plan:  H&P condition reviewed dorsal injection administered 3 mg Kenalog 500 Xylocaine and I discussed hammertoe correction for the fifth toe left explained procedure and recovery.  Debrided today and reappoint to recheck

## 2017-04-06 DIAGNOSIS — R0789 Other chest pain: Secondary | ICD-10-CM | POA: Diagnosis not present

## 2017-04-06 DIAGNOSIS — R079 Chest pain, unspecified: Secondary | ICD-10-CM | POA: Diagnosis not present

## 2017-04-06 DIAGNOSIS — I10 Essential (primary) hypertension: Secondary | ICD-10-CM | POA: Diagnosis not present

## 2017-04-26 DIAGNOSIS — I10 Essential (primary) hypertension: Secondary | ICD-10-CM | POA: Diagnosis not present

## 2017-04-26 DIAGNOSIS — F419 Anxiety disorder, unspecified: Secondary | ICD-10-CM | POA: Diagnosis not present

## 2017-04-26 DIAGNOSIS — Z23 Encounter for immunization: Secondary | ICD-10-CM | POA: Diagnosis not present

## 2017-04-26 DIAGNOSIS — G4700 Insomnia, unspecified: Secondary | ICD-10-CM | POA: Diagnosis not present

## 2017-04-26 DIAGNOSIS — Z0000 Encounter for general adult medical examination without abnormal findings: Secondary | ICD-10-CM | POA: Diagnosis not present

## 2017-04-26 DIAGNOSIS — M8580 Other specified disorders of bone density and structure, unspecified site: Secondary | ICD-10-CM | POA: Diagnosis not present

## 2017-04-26 DIAGNOSIS — Z1389 Encounter for screening for other disorder: Secondary | ICD-10-CM | POA: Diagnosis not present

## 2017-05-18 DIAGNOSIS — S83242A Other tear of medial meniscus, current injury, left knee, initial encounter: Secondary | ICD-10-CM | POA: Diagnosis not present

## 2017-05-23 DIAGNOSIS — M25562 Pain in left knee: Secondary | ICD-10-CM | POA: Diagnosis not present

## 2017-05-24 DIAGNOSIS — Z23 Encounter for immunization: Secondary | ICD-10-CM | POA: Diagnosis not present

## 2017-05-29 DIAGNOSIS — M1712 Unilateral primary osteoarthritis, left knee: Secondary | ICD-10-CM | POA: Diagnosis not present

## 2017-07-06 ENCOUNTER — Ambulatory Visit (INDEPENDENT_AMBULATORY_CARE_PROVIDER_SITE_OTHER): Payer: Medicare Other | Admitting: Podiatry

## 2017-07-06 ENCOUNTER — Encounter: Payer: Self-pay | Admitting: Podiatry

## 2017-07-06 DIAGNOSIS — M779 Enthesopathy, unspecified: Secondary | ICD-10-CM | POA: Diagnosis not present

## 2017-07-06 MED ORDER — TRIAMCINOLONE ACETONIDE 10 MG/ML IJ SUSP
10.0000 mg | Freq: Once | INTRAMUSCULAR | Status: AC
  Administered 2017-07-06: 10 mg

## 2017-07-06 NOTE — Progress Notes (Signed)
Subjective:   Patient ID: Kendra BottsSusan C Mathis, female   DOB: 67 y.o.   MRN: [id number]   HPI patient presents stating she is having knee replacement and has inflammation on top of her left foot she would like to get reduced   ROS      Objective:  Physical Exam  Neurovascular status intact with patient noted on top of the left foot have inflammation and pain along the dorsal tendon complex     Assessment:  Tendinitis of the dorsum left sterile prep applied and injected the dorsal tendon complex left 3 mg Kenalog 5 mg Xylocaine     Plan:  And advised on heat ice therapy

## 2017-07-26 DIAGNOSIS — Z23 Encounter for immunization: Secondary | ICD-10-CM | POA: Diagnosis not present

## 2017-07-26 DIAGNOSIS — M1712 Unilateral primary osteoarthritis, left knee: Secondary | ICD-10-CM | POA: Diagnosis not present

## 2017-07-26 DIAGNOSIS — Z01818 Encounter for other preprocedural examination: Secondary | ICD-10-CM | POA: Diagnosis not present

## 2017-07-26 DIAGNOSIS — I10 Essential (primary) hypertension: Secondary | ICD-10-CM | POA: Diagnosis not present

## 2017-07-26 DIAGNOSIS — M25562 Pain in left knee: Secondary | ICD-10-CM | POA: Diagnosis not present

## 2017-07-26 NOTE — H&P (Signed)
TOTAL KNEE ADMISSION H&P  Patient is being admitted for left total knee arthroplasty.  Subjective:  Chief Complaint:left knee pain.  HPI: Kendra Mathis, 67 y.o. female, has a history of pain and functional disability in the left knee due to arthritis and has failed non-surgical conservative treatments for greater than 12 weeks to includeNSAID's and/or analgesics, corticosteriod injections, viscosupplementation injections, flexibility and strengthening excercises, supervised PT with diminished ADL's post treatment, weight reduction as appropriate and activity modification.  Onset of symptoms was gradual, starting 10 years ago with gradually worsening course since that time. The patient noted prior procedures on the knee to include  arthroscopy and menisectomy on the left knee(s).  Patient currently rates pain in the left knee(s) at 10 out of 10 with activity. Patient has night pain, worsening of pain with activity and weight bearing, pain that interferes with activities of daily living, crepitus and joint swelling.  Patient has evidence of subchondral sclerosis, periarticular osteophytes and joint space narrowing by imaging studies. There is no active infection.  Patient Active Problem List   Diagnosis Date Noted  . Primary osteoarthritis of right hip 12/29/2014  . 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 Arthritis   . Back pain    scoliosis  . History of colon polyps    benign  . Hyperlipidemia    hx of-tried Lovastatin but joint aches -has been off for several yrs   . Hypertension    takes Lisinopril-HCTZ daily  . Nocturia   . Osteopenia    takes Evista every other day  . Palpitations    takes Inderal daily  . Pleurisy    > 15 yrs ago  . PONV (postoperative nausea and vomiting)   . Stress incontinence   . Urinary urgency     Past Surgical History:  Procedure Laterality Date  . BACK SURGERY    . CESAREAN SECTION  1983   . COLONOSCOPY    . KNEE ARTHROSCOPY W/ MEDIAL COLLATERAL LIGAMENT (MCL) REPAIR Left ~ 2012  . LUMBAR DISC SURGERY  ~ 1996  . OOPHORECTOMY  1998   "don't remember which side"  . REVISION TOTAL HIP ARTHROPLASTY Right 12/29/2014  . TOTAL HIP ARTHROPLASTY Right 1998  . TOTAL HIP REVISION Right 12/29/2014   Procedure: TOTAL HIP REVISION;  Surgeon: Gean Birchwood, MD;  Location: [facility] OR;  Service: Orthopedics;  Laterality: Right;    Current Facility-Administered Medications  Medication Dose Route Frequency Provider Last Rate Last Dose  . triamcinolone acetonide (KENALOG) 10 MG/ML injection 10 mg  10 mg Other Once Lenn Sink, DPM       Current Outpatient Medications  Medication Sig Dispense Refill Last Dose  . ALPRAZolam (XANAX) 0.5 MG tablet Take 0.25 mg by mouth daily as needed for anxiety.   07/25/2017 at Unknown time  . cholecalciferol (VITAMIN D) 1000 UNITS tablet Take 1,000 Units by mouth daily.   07/26/2017 at Unknown time  . docusate sodium (COLACE) 100 MG capsule Take 100 mg by mouth daily as needed for mild constipation.   Past Week at Unknown time  . lisinopril-hydrochlorothiazide (PRINZIDE,ZESTORETIC) 20-12.5 MG per tablet Take 1 tablet by mouth daily.   07/26/2017 at Unknown time  . naproxen sodium (ANAPROX) 220 MG tablet Take 220 mg by mouth daily.    07/26/2017 at Unknown time  . NONFORMULARY OR COMPOUNDED ITEM [facility] compound:  Antiinflammatory Cream - Diclofenac 3%, Baclofen 2%, Cyclobenzaprine 2%, Lidocaine  2% dispense 180 grams apply 1-2 grams to affected area 3-4 times daily, +5refill. (Patient taking differently: [facility] compound:  Antiinflammatory Cream - Diclofenac 3%, Baclofen 2%, Cyclobenzaprine 2%, Lidocaine 2% dispense 180 grams apply 1-2 grams to affected area 2-3 times daily, +5refill.) 180 each 2 07/26/2017 at Unknown time  . Omega-3-6-9 CAPS Take 1 capsule by mouth daily.   07/26/2017 at Unknown time  . Polyethyl Glycol-Propyl Glycol (SYSTANE OP) Place 1 drop  into both eyes daily.   07/26/2017 at Unknown time  . propranolol (INDERAL) 40 MG tablet Take 40 mg by mouth 2 (two) times daily.    07/26/2017 at Unknown time  . pyridOXINE (VITAMIN B-6) 100 MG tablet Take 200 mg by mouth daily.   07/26/2017 at Unknown time  . raloxifene (EVISTA) 60 MG tablet Take 60 mg by mouth daily.   07/26/2017 at Unknown time  . aspirin EC 325 MG tablet Take 1 tablet (325 mg total) by mouth 2 (two) times daily. (Patient not taking: Reported on 07/21/2017) 30 tablet 0 Not Taking at Unknown time  . chlorpheniramine (CHLOR-TRIMETON) 4 MG tablet Take 4 mg by mouth daily as needed for allergies.   Unknown at Unknown time   Allergies  Allergen Reactions  . Benadryl [Diphenhydramine]     Dizziness  . Codeine Nausea Only    Percocet ok    Social History   Tobacco Use  . Smoking status: Never Smoker  . Smokeless tobacco: Never Used  Substance Use Topics  . Alcohol use: Yes    Comment: 12/29/2014 "1 drink q other week at the most"    No family history on file.   Review of Systems  Constitutional: Negative.   HENT: Negative.   Eyes: Negative.   Respiratory: Negative.   Cardiovascular: Negative.   Gastrointestinal: Negative.   Genitourinary: Negative.   Musculoskeletal: Positive for back pain and joint pain.  Skin: Negative.   Neurological: Negative.   Endo/Heme/Allergies: Negative.   Psychiatric/Behavioral: Negative.     Objective:  Physical Exam  Constitutional: She is oriented to person, place, and time. She appears well-developed and well-nourished.  HENT:  Head: Normocephalic and atraumatic.  Mouth/Throat: Oropharynx is clear and moist.  Eyes: Pupils are equal, round, and reactive to light. Conjunctivae are normal.  Neck: Normal range of motion. Neck supple.  Cardiovascular: Normal rate and regular rhythm.  Respiratory: Effort normal and breath sounds normal.  GI: Soft.  Genitourinary:  Genitourinary Comments: Not pertinent to current symptomatology  therefore not examined.  Musculoskeletal:  Examination of her left knee reveals pain medially.  1+ synovitis.  Range of motion 0-120 degrees.  Knee is stable with normal patella tracking.  Examination of the right knee reveals pain medially.  1+ synovitis.  Range of motion 0-120 degrees.  Knee is stable with normal patella tracking.  Vascular exam: Pulses are 2+ and symmetric.    Neurological: She is alert and oriented to person, place, and time.  Skin: Skin is warm and dry.  Psychiatric: She has a normal mood and affect. Her behavior is normal.    Vital signs in last 24 hours: Temp:  [97.8 F (36.6 C)] 97.8 F (36.6 C) (05/08 1500) Pulse Rate:  [77] 77 (05/08 1500) BP: (125)/(78) 125/78 (05/08 1500) SpO2:  [98 %] 98 % (05/08 1500) Weight:  [56.3 kg (124 lb 3.2 oz)] 56.3 kg (124 lb 3.2 oz) (05/08 1500)  Labs:   Estimated body mass index is 25.09 kg/m as calculated from the following:  Height as of this encounter:  (1.499 m).   Weight as of this encounter: 56.3 kg (124 lb 3.2 oz).   Imaging Review Plain radiographs demonstrate severe degenerative joint disease of the left knee(s). The overall alignment issignificant varus. The bone quality appears to be good for age and reported activity level.   Preoperative templating of the joint replacement has been completed, documented, and submitted to the Operating Room personnel in order to optimize intra-operative equipment management.          Assessment/Plan:  End stage arthritis, left knee   The patient history, physical examination, clinical judgment of the provider and imaging studies are consistent with end stage degenerative joint disease of the left knee(s) and total knee arthroplasty is deemed medically necessary. The treatment options including medical management, injection therapy arthroscopy and arthroplasty were discussed at length. The risks and benefits of total knee arthroplasty were presented and reviewed.  The risks due to aseptic loosening, infection, stiffness, patella tracking problems, thromboembolic complications and other imponderables were discussed. The patient acknowledged the explanation, agreed to proceed with the plan and consent was signed. Patient is being admitted for inpatient treatment for surgery, pain control, PT, OT, prophylactic antibiotics, VTE prophylaxis, progressive ambulation and ADL's and discharge planning. The patient is planning to be discharged home with home health services

## 2017-07-26 NOTE — Pre-Procedure Instructions (Signed)
Kendra Mathis  07/26/2017      Kendra Mathis [place] [address] [place] - [address] [place] [address] Phone: [phone] Fax: [phone]    Your procedure is scheduled on Aug 07, 2017.  Report to [facility] Admitting at 1035 AM.  Call this number if you have problems the morning of surgery:  [phone]   Continue all medications as directed by your physician except follow these medication instructions before surgery below   Remember:  Do not eat food or drink liquids after midnight.  Take these medicines the morning of surgery with A SIP OF WATER  Propanolol (inderal) alprazolam (xanax)-if needed raloxifine (Evista)  Follow your doctor's instructions on taking your aspirin  7 days prior to surgery STOP taking any Aspirin  Aleve, Naproxen, Ibuprofen, Motrin, Advil, Goody's, BC's, all herbal medications, fish oil, and all vitamins   Do not wear jewelry, make-up or nail polish.  Do not wear lotions, powders, or perfumes, or deodorant.  Do not shave 48 hours prior to surgery.    Do not bring valuables to the hospital.  [facility] is not responsible for any belongings or valuables.  Contacts, dentures or bridgework may not be worn into surgery.  Leave your suitcase in the car.  After surgery it may be brought to your room.  For patients admitted to the hospital, discharge time will be determined by your treatment team.  Patients discharged the day of surgery will not be allowed to drive home.    [facility]- Preparing For Surgery  Before surgery, you can play an important role. Because skin is not sterile, your skin needs to be as free of germs as possible. You can reduce the number of germs on your skin by washing with CHG (chlorahexidine gluconate) Soap before surgery.  CHG is an antiseptic cleaner which kills germs and bonds with the skin to continue killing germs even after washing.  Oral Hygiene is also important to  reduce your risk of infection.  Remember - BRUSH YOUR TEETH THE MORNING OF SURGERY  Please do not use if you have an allergy to CHG or antibacterial soaps. If your skin becomes reddened/irritated stop using the CHG.  Do not shave (including legs and underarms) for at least 48 hours prior to first CHG shower. It is OK to shave your face.  Please follow these instructions carefully.   1. Shower the NIGHT BEFORE SURGERY and the MORNING OF SURGERY with CHG.   2. If you chose to wash your hair, wash your hair first as usual with your normal shampoo.  3. After you shampoo, rinse your hair and body thoroughly to remove the shampoo.  4. Use CHG as you would any other liquid soap. You can apply CHG directly to the skin and wash gently with a scrungie or a clean washcloth.   5. Apply the CHG Soap to your body ONLY FROM THE NECK DOWN.  Do not use on open wounds or open sores. Avoid contact with your eyes, ears, mouth and genitals (private parts). Wash Face and genitals (private parts)  with your normal soap.  6. Wash thoroughly, paying special attention to the area where your surgery will be performed.  7. Thoroughly rinse your body with warm water from the neck down.  8. DO NOT shower/wash with your normal soap after using and rinsing off the CHG Soap.  9. Pat yourself dry with a CLEAN TOWEL.  10.  Wear CLEAN PAJAMAS to bed the night before surgery, wear comfortable clothes the morning of surgery  11. Place CLEAN SHEETS on your bed the night of your first shower and DO NOT SLEEP WITH PETS.  Day of Surgery: Do not apply any deodorants/lotions. Please wear clean clothes to the hospital/surgery center.  Remember to brush your teeth.    Please read over the following fact sheets that you were given. Pain Booklet, Coughing and Deep Breathing, MRSA Information and Surgical Site Infection Prevention

## 2017-07-27 ENCOUNTER — Encounter (HOSPITAL_COMMUNITY): Payer: Self-pay

## 2017-07-27 ENCOUNTER — Encounter (HOSPITAL_COMMUNITY)
Admission: RE | Admit: 2017-07-27 | Discharge: 2017-07-27 | Disposition: A | Discharge status: Home / Self Care | Payer: Medicare Other | Source: Ambulatory Visit | Attending: Orthopedic Surgery | Admitting: Orthopedic Surgery

## 2017-07-27 DIAGNOSIS — Z01812 Encounter for preprocedural laboratory examination: Secondary | ICD-10-CM | POA: Insufficient documentation

## 2017-07-27 LAB — CBC WITH DIFFERENTIAL/PLATELET
Basophils Absolute: 0 10*3/uL (ref 0.0–0.1)
Basophils Relative: 0 %
EOS PCT: 2 %
Eosinophils Absolute: 0.2 10*3/uL (ref 0.0–0.7)
HCT: 40.8 % (ref 36.0–46.0)
Hemoglobin: 13.1 g/dL (ref 12.0–15.0)
LYMPHS ABS: 2 10*3/uL (ref 0.7–4.0)
LYMPHS PCT: 25 %
MCH: 29.6 pg (ref 26.0–34.0)
MCHC: 32.1 g/dL (ref 30.0–36.0)
MCV: 92.1 fL (ref 78.0–100.0)
MONO ABS: 0.5 10*3/uL (ref 0.1–1.0)
MONOS PCT: 6 %
Neutro Abs: 5.2 10*3/uL (ref 1.7–7.7)
Neutrophils Relative %: 67 %
PLATELETS: 288 10*3/uL (ref 150–400)
RBC: 4.43 MIL/uL (ref 3.87–5.11)
RDW: 15.2 % (ref 11.5–15.5)
WBC: 7.8 10*3/uL (ref 4.0–10.5)

## 2017-07-27 LAB — COMPREHENSIVE METABOLIC PANEL
ALT: 22 U/L (ref 14–54)
ANION GAP: 7 (ref 5–15)
AST: 27 U/L (ref 15–41)
Albumin: 3.8 g/dL (ref 3.5–5.0)
Alkaline Phosphatase: 83 U/L (ref 38–126)
BUN: 19 mg/dL (ref 6–20)
CHLORIDE: 105 mmol/L (ref 101–111)
CO2: 29 mmol/L (ref 22–32)
CREATININE: 0.8 mg/dL (ref 0.44–1.00)
Calcium: 9.4 mg/dL (ref 8.9–10.3)
Glucose, Bld: 93 mg/dL (ref 65–99)
Potassium: 3.2 mmol/L — ABNORMAL LOW (ref 3.5–5.1)
Sodium: 141 mmol/L (ref 135–145)
Total Bilirubin: 0.8 mg/dL (ref 0.3–1.2)
Total Protein: 6.9 g/dL (ref 6.5–8.1)

## 2017-07-27 LAB — SURGICAL PCR SCREEN
MRSA, PCR: NEGATIVE
Staphylococcus aureus: POSITIVE — AB

## 2017-07-27 LAB — APTT: aPTT: 26 seconds (ref 24–36)

## 2017-07-27 LAB — PROTIME-INR
INR: 0.99
PROTHROMBIN TIME: 13 s (ref 11.4–15.2)

## 2017-07-27 NOTE — Progress Notes (Signed)
Mupirocin Ointment Rx called into [name] at [facility] for positive PCR of Staph. Left message on pt's voicemail informing her of result and need to pick up Rx.

## 2017-07-28 LAB — URINE CULTURE: Culture: NO GROWTH

## 2017-08-02 ENCOUNTER — Other Ambulatory Visit: Payer: Self-pay | Admitting: Orthopedic Surgery

## 2017-08-02 NOTE — Care Plan (Signed)
Met with patient in the office with PA. Planning to discharge to home. Will have HHPT if procedure is a total and go straight to OPPT if partial. Her HHPT would be from [facility]. She is scheduled to follow up in the office with Dr.Wainer on 08/21/17 @ 215.   Please contact Ladell Heads, [place] with questions or if this plan needs to change.   Thanks

## 2017-08-04 MED ORDER — BUPIVACAINE LIPOSOME 1.3 % IJ SUSP
20.0000 mL | INTRAMUSCULAR | Status: DC
  Filled 2017-08-04: qty 20

## 2017-08-04 MED ORDER — TRANEXAMIC ACID 1000 MG/10ML IV SOLN
1000.0000 mg | INTRAVENOUS | Status: AC
  Administered 2017-08-07: 1000 mg via INTRAVENOUS
  Filled 2017-08-04: qty 1100

## 2017-08-07 ENCOUNTER — Encounter (HOSPITAL_COMMUNITY)
Admission: RE | Disposition: A | Discharge status: Home Health | Payer: Self-pay | Source: Ambulatory Visit | Attending: Orthopedic Surgery

## 2017-08-07 ENCOUNTER — Inpatient Hospital Stay (HOSPITAL_COMMUNITY)
Admission: RE | Admit: 2017-08-07 | Discharge: 2017-08-09 | DRG: 470 | Disposition: A | Discharge status: Home Health | Payer: Medicare Other | Source: Ambulatory Visit | Attending: Orthopedic Surgery | Admitting: Orthopedic Surgery

## 2017-08-07 ENCOUNTER — Encounter (HOSPITAL_COMMUNITY): Payer: Self-pay | Admitting: Certified Registered Nurse Anesthetist

## 2017-08-07 ENCOUNTER — Inpatient Hospital Stay (HOSPITAL_COMMUNITY): Payer: Medicare Other | Admitting: Certified Registered Nurse Anesthetist

## 2017-08-07 ENCOUNTER — Inpatient Hospital Stay (HOSPITAL_COMMUNITY): Payer: Medicare Other | Admitting: Emergency Medicine

## 2017-08-07 DIAGNOSIS — Z96641 Presence of right artificial hip joint: Secondary | ICD-10-CM | POA: Diagnosis present

## 2017-08-07 DIAGNOSIS — Z888 Allergy status to other drugs, medicaments and biological substances status: Secondary | ICD-10-CM | POA: Diagnosis not present

## 2017-08-07 DIAGNOSIS — I10 Essential (primary) hypertension: Secondary | ICD-10-CM | POA: Diagnosis present

## 2017-08-07 DIAGNOSIS — Z7982 Long term (current) use of aspirin: Secondary | ICD-10-CM

## 2017-08-07 DIAGNOSIS — E876 Hypokalemia: Secondary | ICD-10-CM | POA: Diagnosis present

## 2017-08-07 DIAGNOSIS — Z86010 Personal history of colonic polyps: Secondary | ICD-10-CM

## 2017-08-07 DIAGNOSIS — M25762 Osteophyte, left knee: Secondary | ICD-10-CM | POA: Diagnosis present

## 2017-08-07 DIAGNOSIS — Z9889 Other specified postprocedural states: Secondary | ICD-10-CM

## 2017-08-07 DIAGNOSIS — Z885 Allergy status to narcotic agent status: Secondary | ICD-10-CM | POA: Diagnosis not present

## 2017-08-07 DIAGNOSIS — N393 Stress incontinence (female) (male): Secondary | ICD-10-CM | POA: Diagnosis present

## 2017-08-07 DIAGNOSIS — E669 Obesity, unspecified: Secondary | ICD-10-CM | POA: Diagnosis present

## 2017-08-07 DIAGNOSIS — G8918 Other acute postprocedural pain: Secondary | ICD-10-CM | POA: Diagnosis not present

## 2017-08-07 DIAGNOSIS — R112 Nausea with vomiting, unspecified: Secondary | ICD-10-CM | POA: Diagnosis not present

## 2017-08-07 DIAGNOSIS — M8580 Other specified disorders of bone density and structure, unspecified site: Secondary | ICD-10-CM | POA: Diagnosis present

## 2017-08-07 DIAGNOSIS — R002 Palpitations: Secondary | ICD-10-CM | POA: Diagnosis present

## 2017-08-07 DIAGNOSIS — M1712 Unilateral primary osteoarthritis, left knee: Principal | ICD-10-CM | POA: Diagnosis present

## 2017-08-07 DIAGNOSIS — Z6825 Body mass index (BMI) 25.0-25.9, adult: Secondary | ICD-10-CM

## 2017-08-07 DIAGNOSIS — Z79899 Other long term (current) drug therapy: Secondary | ICD-10-CM

## 2017-08-07 HISTORY — DX: Other specified postprocedural states: Z98.890

## 2017-08-07 HISTORY — DX: Nausea with vomiting, unspecified: R11.2

## 2017-08-07 HISTORY — DX: Hypokalemia: E87.6

## 2017-08-07 HISTORY — PX: TOTAL KNEE ARTHROPLASTY: SHX125

## 2017-08-07 SURGERY — ARTHROPLASTY, KNEE, TOTAL
Anesthesia: Monitor Anesthesia Care | Site: Knee | Laterality: Left

## 2017-08-07 MED ORDER — ALPRAZOLAM 0.25 MG PO TABS
0.2500 mg | ORAL_TABLET | Freq: Every day | ORAL | Status: DC | PRN
  Administered 2017-08-07 – 2017-08-08 (×2): 0.25 mg via ORAL
  Filled 2017-08-07 (×2): qty 1

## 2017-08-07 MED ORDER — ROPIVACAINE HCL 5 MG/ML IJ SOLN
INTRAMUSCULAR | Status: DC | PRN
  Administered 2017-08-07: 30 mL via PERINEURAL

## 2017-08-07 MED ORDER — DEXAMETHASONE SODIUM PHOSPHATE 10 MG/ML IJ SOLN
INTRAMUSCULAR | Status: AC
  Filled 2017-08-07: qty 1

## 2017-08-07 MED ORDER — BUPIVACAINE LIPOSOME 1.3 % IJ SUSP
INTRAMUSCULAR | Status: DC | PRN
  Administered 2017-08-07: 20 mL

## 2017-08-07 MED ORDER — LACTATED RINGERS IV SOLN
INTRAVENOUS | Status: DC
  Administered 2017-08-07: 12:00:00 via INTRAVENOUS
  Administered 2017-08-07: 10 mL/h via INTRAVENOUS

## 2017-08-07 MED ORDER — SODIUM CHLORIDE 0.9 % IJ SOLN
INTRAMUSCULAR | Status: DC | PRN
  Administered 2017-08-07: 50 mL

## 2017-08-07 MED ORDER — BUPIVACAINE IN DEXTROSE 0.75-8.25 % IT SOLN
INTRATHECAL | Status: DC | PRN
  Administered 2017-08-07: 1.6 mL via INTRATHECAL

## 2017-08-07 MED ORDER — CEFUROXIME SODIUM 1.5 G IV SOLR
INTRAVENOUS | Status: AC
  Filled 2017-08-07: qty 1.5

## 2017-08-07 MED ORDER — ALUM & MAG HYDROXIDE-SIMETH 200-200-20 MG/5ML PO SUSP: 30.0000 mL | ORAL | Status: DC | PRN

## 2017-08-07 MED ORDER — POVIDONE-IODINE 7.5 % EX SOLN: Freq: Once | CUTANEOUS | Status: DC

## 2017-08-07 MED ORDER — GABAPENTIN 300 MG PO CAPS
300.0000 mg | ORAL_CAPSULE | Freq: Every day | ORAL | Status: DC
  Administered 2017-08-07: 300 mg via ORAL
  Filled 2017-08-07 (×2): qty 1

## 2017-08-07 MED ORDER — VITAMIN D 1000 UNITS PO TABS
1000.0000 [IU] | ORAL_TABLET | Freq: Every day | ORAL | Status: DC
  Administered 2017-08-08: 1000 [IU] via ORAL
  Filled 2017-08-07: qty 1

## 2017-08-07 MED ORDER — BUPIVACAINE-EPINEPHRINE (PF) 0.5% -1:200000 IJ SOLN
INTRAMUSCULAR | Status: AC
  Filled 2017-08-07: qty 60

## 2017-08-07 MED ORDER — ONDANSETRON HCL 4 MG PO TABS: 4.0000 mg | ORAL_TABLET | Freq: Four times a day (QID) | ORAL | Status: DC | PRN

## 2017-08-07 MED ORDER — CHLORHEXIDINE GLUCONATE 4 % EX LIQD: 60.0000 mL | Freq: Once | CUTANEOUS | Status: DC

## 2017-08-07 MED ORDER — RALOXIFENE HCL 60 MG PO TABS
60.0000 mg | ORAL_TABLET | Freq: Every day | ORAL | Status: DC
  Administered 2017-08-08: 60 mg via ORAL
  Filled 2017-08-07 (×3): qty 1

## 2017-08-07 MED ORDER — BUPIVACAINE-EPINEPHRINE 0.5% -1:200000 IJ SOLN
INTRAMUSCULAR | Status: DC | PRN
  Administered 2017-08-07: 50 mL

## 2017-08-07 MED ORDER — POLYETHYLENE GLYCOL 3350 17 G PO PACK
17.0000 g | PACK | Freq: Two times a day (BID) | ORAL | Status: DC
  Filled 2017-08-07: qty 1

## 2017-08-07 MED ORDER — OXYCODONE HCL 5 MG PO TABS
5.0000 mg | ORAL_TABLET | ORAL | Status: DC | PRN
  Administered 2017-08-07: 10 mg via ORAL
  Filled 2017-08-07: qty 1
  Filled 2017-08-07: qty 2

## 2017-08-07 MED ORDER — POLYVINYL ALCOHOL 1.4 % OP SOLN
Freq: Every day | OPHTHALMIC | Status: DC
  Administered 2017-08-08: 11:00:00 via OPHTHALMIC
  Filled 2017-08-07 (×2): qty 15

## 2017-08-07 MED ORDER — PROPRANOLOL HCL 20 MG PO TABS
40.0000 mg | ORAL_TABLET | Freq: Two times a day (BID) | ORAL | Status: DC
  Administered 2017-08-07 – 2017-08-08 (×3): 40 mg via ORAL
  Filled 2017-08-07 (×3): qty 2

## 2017-08-07 MED ORDER — CEFAZOLIN SODIUM-DEXTROSE 2-4 GM/100ML-% IV SOLN
2.0000 g | Freq: Four times a day (QID) | INTRAVENOUS | Status: AC
  Administered 2017-08-08: 2 g via INTRAVENOUS
  Filled 2017-08-07 (×2): qty 100

## 2017-08-07 MED ORDER — ONDANSETRON HCL 4 MG/2ML IJ SOLN
INTRAMUSCULAR | Status: DC | PRN
  Administered 2017-08-07: 4 mg via INTRAVENOUS

## 2017-08-07 MED ORDER — POTASSIUM CHLORIDE IN NACL 20-0.9 MEQ/L-% IV SOLN
INTRAVENOUS | Status: DC
  Administered 2017-08-07 – 2017-08-09 (×2): via INTRAVENOUS
  Filled 2017-08-07 (×4): qty 1000

## 2017-08-07 MED ORDER — FENTANYL CITRATE (PF) 100 MCG/2ML IJ SOLN
25.0000 ug | INTRAMUSCULAR | Status: DC | PRN
  Administered 2017-08-07: 25 ug via INTRAVENOUS

## 2017-08-07 MED ORDER — ACETAMINOPHEN 500 MG PO TABS
1000.0000 mg | ORAL_TABLET | Freq: Four times a day (QID) | ORAL | Status: AC
  Administered 2017-08-07 – 2017-08-08 (×4): 1000 mg via ORAL
  Filled 2017-08-07 (×4): qty 2

## 2017-08-07 MED ORDER — PROPOFOL 500 MG/50ML IV EMUL
INTRAVENOUS | Status: DC | PRN
Start: 2017-08-07 — End: 2017-08-07
  Administered 2017-08-07: 75 ug/kg/min via INTRAVENOUS

## 2017-08-07 MED ORDER — PROPOFOL 10 MG/ML IV BOLUS
INTRAVENOUS | Status: DC | PRN
  Administered 2017-08-07: 20 mg via INTRAVENOUS

## 2017-08-07 MED ORDER — OXYCODONE HCL 5 MG/5ML PO SOLN: 5.0000 mg | Freq: Once | ORAL | Status: AC | PRN

## 2017-08-07 MED ORDER — MENTHOL 3 MG MT LOZG: 1.0000 | LOZENGE | OROMUCOSAL | Status: DC | PRN

## 2017-08-07 MED ORDER — MIDAZOLAM HCL 2 MG/2ML IJ SOLN
INTRAMUSCULAR | Status: DC | PRN
  Administered 2017-08-07: 2 mg via INTRAVENOUS

## 2017-08-07 MED ORDER — DEXAMETHASONE SODIUM PHOSPHATE 4 MG/ML IJ SOLN
INTRAMUSCULAR | Status: DC | PRN
  Administered 2017-08-07: 10 mg via INTRAVENOUS

## 2017-08-07 MED ORDER — PHENYLEPHRINE HCL 10 MG/ML IJ SOLN
INTRAMUSCULAR | Status: DC | PRN
  Administered 2017-08-07 (×3): 80 ug via INTRAVENOUS

## 2017-08-07 MED ORDER — DEXAMETHASONE SODIUM PHOSPHATE 10 MG/ML IJ SOLN
10.0000 mg | Freq: Three times a day (TID) | INTRAMUSCULAR | Status: AC
  Administered 2017-08-07 – 2017-08-08 (×3): 10 mg via INTRAVENOUS
  Filled 2017-08-07 (×4): qty 1

## 2017-08-07 MED ORDER — ONDANSETRON HCL 4 MG/2ML IJ SOLN
4.0000 mg | Freq: Four times a day (QID) | INTRAMUSCULAR | Status: DC | PRN
  Administered 2017-08-08: 4 mg via INTRAVENOUS
  Filled 2017-08-07: qty 2

## 2017-08-07 MED ORDER — OXYCODONE HCL 5 MG PO TABS
ORAL_TABLET | ORAL | Status: AC
  Administered 2017-08-07: 5 mg via ORAL
  Filled 2017-08-07: qty 1

## 2017-08-07 MED ORDER — 0.9 % SODIUM CHLORIDE (POUR BTL) OPTIME
TOPICAL | Status: DC | PRN
  Administered 2017-08-07: 1000 mL

## 2017-08-07 MED ORDER — FENTANYL CITRATE (PF) 100 MCG/2ML IJ SOLN
INTRAMUSCULAR | Status: AC
  Filled 2017-08-07: qty 2

## 2017-08-07 MED ORDER — FENTANYL CITRATE (PF) 250 MCG/5ML IJ SOLN
INTRAMUSCULAR | Status: AC
  Filled 2017-08-07: qty 5

## 2017-08-07 MED ORDER — METOCLOPRAMIDE HCL 5 MG/ML IJ SOLN
5.0000 mg | Freq: Three times a day (TID) | INTRAMUSCULAR | Status: DC | PRN
  Administered 2017-08-08: 10 mg via INTRAVENOUS
  Filled 2017-08-07: qty 2

## 2017-08-07 MED ORDER — CEFUROXIME SODIUM 1.5 G IV SOLR
INTRAVENOUS | Status: DC | PRN
  Administered 2017-08-07: 1.5 g via INTRAVENOUS

## 2017-08-07 MED ORDER — CEFAZOLIN SODIUM-DEXTROSE 2-4 GM/100ML-% IV SOLN
2.0000 g | INTRAVENOUS | Status: AC
  Administered 2017-08-07: 2 g via INTRAVENOUS
  Filled 2017-08-07: qty 100

## 2017-08-07 MED ORDER — SODIUM CHLORIDE 0.9 % IR SOLN
Status: DC | PRN
  Administered 2017-08-07: 3000 mL

## 2017-08-07 MED ORDER — PHENOL 1.4 % MT LIQD: 1.0000 | OROMUCOSAL | Status: DC | PRN

## 2017-08-07 MED ORDER — ASPIRIN EC 325 MG PO TBEC
325.0000 mg | DELAYED_RELEASE_TABLET | Freq: Every day | ORAL | Status: DC
  Administered 2017-08-08 – 2017-08-09 (×2): 325 mg via ORAL
  Filled 2017-08-07 (×2): qty 1

## 2017-08-07 MED ORDER — DEXTROSE 5 % IV SOLN
INTRAVENOUS | Status: DC | PRN
  Administered 2017-08-07: 40 ug/min via INTRAVENOUS

## 2017-08-07 MED ORDER — METOCLOPRAMIDE HCL 5 MG PO TABS: 5.0000 mg | ORAL_TABLET | Freq: Three times a day (TID) | ORAL | Status: DC | PRN

## 2017-08-07 MED ORDER — ONDANSETRON HCL 4 MG/2ML IJ SOLN: 4.0000 mg | Freq: Four times a day (QID) | INTRAMUSCULAR | Status: DC | PRN

## 2017-08-07 MED ORDER — DOCUSATE SODIUM 100 MG PO CAPS
100.0000 mg | ORAL_CAPSULE | Freq: Two times a day (BID) | ORAL | Status: DC
  Administered 2017-08-07 – 2017-08-08 (×3): 100 mg via ORAL
  Filled 2017-08-07 (×3): qty 1

## 2017-08-07 MED ORDER — HYDROMORPHONE HCL 2 MG/ML IJ SOLN
0.5000 mg | INTRAMUSCULAR | Status: DC | PRN
  Administered 2017-08-07: 1 mg via INTRAVENOUS
  Administered 2017-08-08: 0.5 mg via INTRAVENOUS
  Filled 2017-08-07 (×2): qty 1

## 2017-08-07 MED ORDER — MIDAZOLAM HCL 2 MG/2ML IJ SOLN
INTRAMUSCULAR | Status: AC
  Filled 2017-08-07: qty 2

## 2017-08-07 MED ORDER — OXYCODONE HCL 5 MG PO TABS
5.0000 mg | ORAL_TABLET | Freq: Once | ORAL | Status: AC | PRN
  Administered 2017-08-07: 5 mg via ORAL

## 2017-08-07 MED ORDER — FENTANYL CITRATE (PF) 100 MCG/2ML IJ SOLN
INTRAMUSCULAR | Status: DC | PRN
  Administered 2017-08-07 (×2): 50 ug via INTRAVENOUS

## 2017-08-07 SURGICAL SUPPLY — 68 items
BANDAGE ESMARK 6X9 LF (GAUZE/BANDAGES/DRESSINGS) ×2 IMPLANT
BENZOIN TINCTURE PRP APPL 2/3 (GAUZE/BANDAGES/DRESSINGS) ×4 IMPLANT
BLADE SAGITTAL 25.0X1.19X90 (BLADE) ×3 IMPLANT
BLADE SAGITTAL 25.0X1.19X90MM (BLADE) ×1
BLADE SAW SGTL 13X75X1.27 (BLADE) ×8 IMPLANT
BLADE SURG 10 STRL SS (BLADE) ×8 IMPLANT
BNDG ELASTIC 6X10 VLCR STRL LF (GAUZE/BANDAGES/DRESSINGS) ×4 IMPLANT
BNDG ELASTIC 6X15 VLCR STRL LF (GAUZE/BANDAGES/DRESSINGS) ×4 IMPLANT
BNDG ESMARK 6X9 LF (GAUZE/BANDAGES/DRESSINGS) ×4
BOWL SMART MIX CTS (DISPOSABLE) ×4 IMPLANT
CAPT KNEE TOTAL 3 ATTUNE ×4 IMPLANT
CEMENT HV SMART SET (Cement) ×8 IMPLANT
CLOSURE WOUND 1/2 X4 (GAUZE/BANDAGES/DRESSINGS) ×1
COVER SURGICAL LIGHT HANDLE (MISCELLANEOUS) ×4 IMPLANT
CUFF TOURNIQUET SINGLE 34IN LL (TOURNIQUET CUFF) ×4 IMPLANT
CUFF TOURNIQUET SINGLE 44IN (TOURNIQUET CUFF) IMPLANT
DECANTER SPIKE VIAL GLASS SM (MISCELLANEOUS) ×4 IMPLANT
DRAPE EXTREMITY T 121X128X90 (DRAPE) ×4 IMPLANT
DRAPE HALF SHEET 40X57 (DRAPES) ×8 IMPLANT
DRAPE INCISE IOBAN 66X45 STRL (DRAPES) IMPLANT
DRAPE ORTHO SPLIT 77X108 STRL (DRAPES) ×2
DRAPE SURG ORHT 6 SPLT 77X108 (DRAPES) ×2 IMPLANT
DRAPE U-SHAPE 47X51 STRL (DRAPES) ×4 IMPLANT
DRSG AQUACEL AG ADV 3.5X10 (GAUZE/BANDAGES/DRESSINGS) ×4 IMPLANT
DURAPREP 26ML APPLICATOR (WOUND CARE) ×4 IMPLANT
ELECT CAUTERY BLADE 6.4 (BLADE) ×4 IMPLANT
ELECT REM PT RETURN 9FT ADLT (ELECTROSURGICAL) ×4
ELECTRODE REM PT RTRN 9FT ADLT (ELECTROSURGICAL) ×2 IMPLANT
FACESHIELD WRAPAROUND (MASK) ×4 IMPLANT
GLOVE BIO SURGEON STRL SZ7 (GLOVE) ×4 IMPLANT
GLOVE BIOGEL PI IND STRL 7.0 (GLOVE) ×2 IMPLANT
GLOVE BIOGEL PI IND STRL 7.5 (GLOVE) ×2 IMPLANT
GLOVE BIOGEL PI INDICATOR 7.0 (GLOVE) ×2
GLOVE BIOGEL PI INDICATOR 7.5 (GLOVE) ×2
GLOVE SS BIOGEL STRL SZ 7.5 (GLOVE) ×2 IMPLANT
GLOVE SUPERSENSE BIOGEL SZ 7.5 (GLOVE) ×2
GOWN STRL REUS W/ TWL LRG LVL3 (GOWN DISPOSABLE) ×2 IMPLANT
GOWN STRL REUS W/ TWL XL LVL3 (GOWN DISPOSABLE) ×2 IMPLANT
GOWN STRL REUS W/TWL LRG LVL3 (GOWN DISPOSABLE) ×2
GOWN STRL REUS W/TWL XL LVL3 (GOWN DISPOSABLE) ×2
HANDPIECE INTERPULSE COAX TIP (DISPOSABLE) ×2
HOOD PEEL AWAY FACE SHEILD DIS (HOOD) ×8 IMPLANT
IMMOBILIZER KNEE 22 UNIV (SOFTGOODS) ×4 IMPLANT
KIT BASIN OR (CUSTOM PROCEDURE TRAY) ×4 IMPLANT
KIT TURNOVER KIT B (KITS) ×4 IMPLANT
MANIFOLD NEPTUNE II (INSTRUMENTS) ×4 IMPLANT
MARKER SKIN DUAL TIP RULER LAB (MISCELLANEOUS) ×4 IMPLANT
NEEDLE HYPO 22GX1.5 SAFETY (NEEDLE) ×8 IMPLANT
NS IRRIG 1000ML POUR BTL (IV SOLUTION) ×4 IMPLANT
PACK TOTAL JOINT (CUSTOM PROCEDURE TRAY) ×4 IMPLANT
PAD ARMBOARD 7.5X6 YLW CONV (MISCELLANEOUS) ×8 IMPLANT
SET HNDPC FAN SPRY TIP SCT (DISPOSABLE) ×2 IMPLANT
STRIP CLOSURE SKIN 1/2X4 (GAUZE/BANDAGES/DRESSINGS) ×3 IMPLANT
SUCTION FRAZIER HANDLE 10FR (MISCELLANEOUS) ×2
SUCTION TUBE FRAZIER 10FR DISP (MISCELLANEOUS) ×2 IMPLANT
SUT MNCRL AB 3-0 PS2 18 (SUTURE) ×4 IMPLANT
SUT VIC AB 0 CT1 27 (SUTURE) ×4
SUT VIC AB 0 CT1 27XBRD ANBCTR (SUTURE) ×4 IMPLANT
SUT VIC AB 1 CT1 27 (SUTURE) ×2
SUT VIC AB 1 CT1 27XBRD ANBCTR (SUTURE) ×2 IMPLANT
SUT VIC AB 2-0 CT1 27 (SUTURE) ×4
SUT VIC AB 2-0 CT1 TAPERPNT 27 (SUTURE) ×4 IMPLANT
SYR CONTROL 10ML LL (SYRINGE) ×8 IMPLANT
TOWEL OR 17X24 6PK STRL BLUE (TOWEL DISPOSABLE) ×4 IMPLANT
TOWEL OR 17X26 10 PK STRL BLUE (TOWEL DISPOSABLE) ×4 IMPLANT
TRAY CATH 16FR W/PLASTIC CATH (SET/KITS/TRAYS/PACK) IMPLANT
TRAY FOLEY CATH SILVER 16FR (SET/KITS/TRAYS/PACK) ×4 IMPLANT
WATER STERILE IRR 1000ML POUR (IV SOLUTION) ×4 IMPLANT

## 2017-08-07 NOTE — Transfer of Care (Signed)
Immediate Anesthesia Transfer of Care Note  Patient: Kendra Mathis  Procedure(s) Performed: TOTAL KNEE ARTHROPLASTY (Left Knee)  Patient Location: PACU  Anesthesia Type:Spinal and MAC combined with regional for post-op pain  Level of Consciousness: awake, alert  and oriented  Airway & Oxygen Therapy: Patient Spontanous Breathing and Patient connected to nasal cannula oxygen  Post-op Assessment: Report given to RN and Post -op Vital signs reviewed and stable  Post vital signs: Reviewed and stable  Last Vitals:  Vitals Value Taken Time  BP 118/65 08/07/2017  1:10 PM  Temp    Pulse 72 08/07/2017  1:18 PM  Resp 14 08/07/2017  1:18 PM  SpO2 99 % 08/07/2017  1:18 PM  Vitals shown include unvalidated device data.  Last Pain:  Vitals:   08/07/17 0941  TempSrc:   PainSc: 0-No pain      Patients Stated Pain Goal: 2 (98/92/11 9417)  Complications: No apparent anesthesia complications

## 2017-08-07 NOTE — Op Note (Signed)
MRN:     [id number] DOB/AGE:    11-27-1950 / 67 y.o.       OPERATIVE REPORT   DATE OF PROCEDURE:  08/07/2017      PREOPERATIVE DIAGNOSIS:   Primary Localized Osteoarthritis left Knee       Estimated body mass index is 25.09 kg/m as calculated from the following:   Height as of this encounter:  (1.499 m).   Weight as of this encounter: 56.3 kg (124 lb 3.2 oz).                                                       POSTOPERATIVE DIAGNOSIS:   Same                                                                 PROCEDURE:  Procedure(s): TOTAL KNEE ARTHROPLASTY Using Depuy Attune RP implants #3 Femur, #3Tibia, 7mm  RP bearing, 29 Patella    SURGEON: Willo Yoon A. Thurston Hole, MD   ASSISTANT: Julien Girt, PA-C, present and scrubbed throughout the case, critical for retraction, instrumentation, and closure.  ANESTHESIA: Spinal with Adductor Nerve Block  TOURNIQUET TIME: 58 minutes   COMPLICATIONS:  None       SPECIMENS: None   INDICATIONS FOR PROCEDURE: The patient has djd of the knee with varus deformities, XR shows bone on bone arthritis. Patient has failed all conservative measures including anti-inflammatory medicines, narcotics, attempts at exercise and weight loss, cortisone injections and viscosupplementation.  Risks and benefits of surgery have been discussed, questions answered.    DESCRIPTION OF PROCEDURE: The patient identified by armband, received right adductor canal block and IV antibiotics, in the holding area at [facility]. Patient taken to the operating room, appropriate anesthetic monitors were attached. Spinal anesthesia induced with the patient in supine position, Foley catheter was inserted. Tourniquet applied high to the operative thigh. Lateral post and foot positioner applied to the table, the lower extremity was then prepped and draped in usual sterile fashion from the ankle to the tourniquet. Time-out procedure was performed. The limb was wrapped with an  Esmarch bandage and the tourniquet inflated to 365 mmHg.   We began the operation by making a 6cm anterior midline incision. Small bleeders in the skin and the subcutaneous tissue identified and cauterized. Transverse retinaculum was incised and reflected medially and a medial parapatellar arthrotomy was accomplished. the patella was everted and theprepatellar fat pad resected. The superficial medial collateral ligament was then elevated from anterior to posterior along the proximal flare of the tibia and anterior half of the menisci resected. The knee was hyperflexed exposing bone on bone arthritis. Peripheral and notch osteophytes as well as the cruciate ligaments were then resected. We continued to work our way around posteriorly along the proximal tibia, and externally rotated the tibia subluxing it out from underneath the femur. A McHale retractor was placed through the notch and a lateral Hohmann retractor placed, and an external tibial guide was placed.  The tibial cutting guide was pinned into place allowing resection of 4 mm of bone medially and about 6 mm of bone  laterally because of her varus deformity.   Satisfied with the tibial resection, we then entered the distal femur 2 mm anterior to the PCL origin with the intramedullary guide rod and applied the distal femoral cutting guide set at 11mm, with 5 degrees of valgus. This was pinned along the epicondylar axis. At this point, the distal femoral cut was accomplished without difficulty. We then sized for a 3 femoral component and pinned the guide in 3 degrees of external rotation.The chamfer cutting guide was pinned into place. The anterior, posterior, and chamfer cuts were accomplished without difficulty followed by the  RP box cutting guide and the box cut. We also removed posterior osteophytes from the posterior femoral condyles. At this time, the knee was brought into full extension. We checked our extension and flexion gaps and found them  symmetric at 7.  The patella thickness measured at 51m m. We set the cutting guide at 13 and removed the posterior patella sized for 29 button and drilled the lollipop. The knee was then once again hyperflexed exposing the proximal tibia. We sized for a # 3 tibial base plate, applied the smokestack and the conical reamer followed by the the Delta fin keel punch. We then hammered into place the  RP trial femoral component, inserted a trial bearing, trial patellar button, and took the knee through range of motion from 0-130 degrees. No thumb pressure was required for patellar tracking.   At this point, all trial components were removed, a double batch of DePuy HV cement with Zinecef   was mixed and applied to all bony metallic mating surfaces. In order, we hammered into place the tibial tray and removed excess cement, the femoral component and removed excess cement, a 7 mm  RP bearing was inserted, and the knee brought to full extension with compression. The patellar button was clamped into place, and excess cement removed. While the cement cured the wound was irrigated out with normal saline solution pulse lavage, and exparel was injected throughout the knee. Ligament stability and patellar tracking were checked and found to be excellent..   The parapatellar arthrotomy was closed with  #1 Vicryl suture. The subcutaneous tissue with 0 and 2-0 undyed Vicryl suture, and 4-0 Monocryl.. A dressing of Aquaseal, 4 x 4, dressing sponges, Webril, and Ace wrap applied. Needle and sponge count were correct times 2.The patient awakened, extubated, and taken to recovery room without difficulty. Vascular status was normal, pulses 2+ and symmetric.    Nilda Simmer 06/12/2017, 8:56 AM

## 2017-08-07 NOTE — Anesthesia Postprocedure Evaluation (Signed)
Anesthesia Post Note  Patient: Kendra Mathis  Procedure(s) Performed: TOTAL KNEE ARTHROPLASTY (Left Knee)     Patient location during evaluation: PACU Anesthesia Type: Spinal and Regional Level of consciousness: oriented and awake and alert Pain management: pain level controlled Vital Signs Assessment: post-procedure vital signs reviewed and stable Respiratory status: spontaneous breathing, respiratory function stable and patient connected to nasal cannula oxygen Cardiovascular status: blood pressure returned to baseline and stable Postop Assessment: no headache, no backache, no apparent nausea or vomiting and spinal receding Anesthetic complications: no    Last Vitals:  Vitals:   08/07/17 1556 08/07/17 1609  BP: 136/70 123/68  Pulse: 85 81  Resp: 15 12  Temp:    SpO2: 90% 96%    Last Pain:  Vitals:   08/07/17 1610  TempSrc:   PainSc: 4                  Ryan P Ellender

## 2017-08-07 NOTE — Evaluation (Signed)
Physical Therapy Evaluation Patient Details Name: Kendra Mathis MRN: [id number] DOB: Jan 02, 1951 Today's Date: 08/07/2017   History of Present Illness  67 y.o. female admitted on 08/07/17 for elective L TKA.  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    Clinical Impression  Pt is POD #0 and is limited by pain and N/V.  She was able to walk with one person assist and RW to the door in her room.  Pt is moving well, just not feeling well yet.   PT to follow acutely for deficits listed below.     Follow Up Recommendations Follow surgeon's recommendation for DC plan and follow-up therapies;Supervision for mobility/OOB    Equipment Recommendations  Rolling walker with 5" wheels(youth walker, pt is 5 feet tall)    Recommendations for Other Services   NA    Precautions / Restrictions Precautions Precautions: Knee Precaution Booklet Issued: Yes (comment) Precaution Comments: knee exercise handout given and precautions reviewed.  Required Braces or Orthoses: Knee Immobilizer - Left Restrictions Weight Bearing Restrictions: Yes LLE Weight Bearing: Weight bearing as tolerated      Mobility  Bed Mobility Overal bed mobility: Needs Assistance Bed Mobility: Supine to Sit     Supine to sit: Min guard     General bed mobility comments: Min guard assist for helping progress left leg to EOB.   Transfers Overall transfer level: Needs assistance Equipment used: Rolling walker (2 wheeled) Transfers: Sit to/from Stand Sit to Stand: Min guard         General transfer comment: Min guard assist for safety. Cues for safe hand placement.   Ambulation/Gait Ambulation/Gait assistance: Min assist Ambulation Distance (Feet): 35 Feet Assistive device: Rolling walker (2 wheeled) Gait Pattern/deviations: Step-through pattern;Antalgic     General Gait Details: Pt with moderately  antalgic gait pattern KI used this evening for safety.                    Balance Overall balance assessment: Needs assistance Sitting-balance support: Feet supported;No upper extremity supported Sitting balance-Leahy Scale: Good     Standing balance support: Bilateral upper extremity supported;No upper extremity supported;Single extremity supported Standing balance-Leahy Scale: Fair                               Pertinent Vitals/Pain Pain Assessment: 0-10 Pain Score: 9  Pain Location: left knee Pain Descriptors / Indicators: Grimacing;Guarding Pain Intervention(s): Monitored during session;Limited activity within patient's tolerance;Repositioned;RN gave pain meds during session    Home Living Family/patient expects to be discharged to:: Private residence Living Arrangements: Spouse/significant other Available Help at Discharge: Family;Available 24 hours/day(husband took two weeks off of work. ) Type of Home: House Home Access: Stairs to enter Entrance Stairs-Rails: Right;Left;Can reach both Secretary/administrator of Steps: 3 Home Layout: One level Home Equipment: Walker - standard;Shower seat - built in;Grab bars - tub/shower;Grab bars - toilet      Prior Function Level of Independence: Independent         Comments: retired     Higher education careers adviser        Extremity/Trunk Assessment   Upper Extremity Assessment Upper Extremity Assessment: Overall WFL for tasks assessed    Lower Extremity Assessment Lower Extremity Assessment: LLE deficits/detail LLE Deficits / Details: left leg with normal post op pain and weakness, ankle at least 3/5, knee 2+/5, hip  flexion 3-/5 per gross functional assessment.     Cervical / Trunk Assessment Cervical / Trunk Assessment: Other exceptions Cervical / Trunk Exceptions: h/o lumbar surgery and scoliosis  Communication   Communication: No difficulties  Cognition Arousal/Alertness: Lethargic;Suspect due to  medications Behavior During Therapy: [facility] for tasks assessed/performed Overall Cognitive Status: Within Functional Limits for tasks assessed                                        General Comments General comments (skin integrity, edema, etc.): Pt nauseated, vomited during session.  De-saturated to 88% on RA, so 2 L O2  re-applied.      Exercises Total Joint Exercises Ankle Circles/Pumps: AROM;Both;20 reps   Assessment/Plan    PT Assessment Patient needs continued PT services  PT Problem List Decreased strength;Decreased range of motion;Decreased activity tolerance;Decreased balance;Decreased mobility;Decreased knowledge of use of DME;Decreased knowledge of precautions;Pain       PT Treatment Interventions DME instruction;Stair training;Gait training;Functional mobility training;Therapeutic activities;Therapeutic exercise;Balance training;Patient/family education;Manual techniques    PT Goals (Current goals can be found in the Care Plan section)  Acute Rehab PT Goals Patient Stated Goal: to go home tomorrow PT Goal Formulation: With patient/family Time For Goal Achievement: 08/14/17 Potential to Achieve Goals: Good    Frequency 7X/week           AM-PAC PT "6 Clicks" Daily Activity  Outcome Measure Difficulty turning over in bed (including adjusting bedclothes, sheets and blankets)?: A Little Difficulty moving from lying on back to sitting on the side of the bed? : A Little Difficulty sitting down on and standing up from a chair with arms (e.g., wheelchair, bedside commode, etc,.)?: A Little Help needed moving to and from a bed to chair (including a wheelchair)?: A Little Help needed walking in hospital room?: None Help needed climbing 3-5 steps with a railing? : None 6 Click Score: 20    End of Session   Activity Tolerance: Other (comment)(limited by nausea and pain) Patient left: in chair;with call bell/phone within reach;with family/visitor  present Nurse Communication: Mobility status PT Visit Diagnosis: Muscle weakness (generalized) (M62.81);Difficulty in walking, not elsewhere classified (R26.2);Pain Pain - Right/Left: Left Pain - part of body: Knee    Time: 1478-2956 PT Time Calculation (min) (ACUTE ONLY): 43 min   Charges:           Lurena Joiner B. Briawna Carver, PT, DPT [phone]   PT Evaluation $PT Eval Moderate Complexity: 1 Mod PT Treatments $Therapeutic Activity: 8-22 mins $Self Care/Home Management: 8-22   08/07/2017, 10:09 PM

## 2017-08-07 NOTE — Interval H&P Note (Signed)
History and Physical Interval Note:  08/07/2017 9:33 AM  Kendra Mathis  has presented today for surgery, with the diagnosis of djd left knee  The various methods of treatment have been discussed with the patient and family. After consideration of risks, benefits and other options for treatment, the patient has consented to  Procedure(s): TOTAL KNEE ARTHROPLASTY (Left) KNEE ARTHROSCOPY WITH MEDIAL MENISECTOMY (Left)as a surgical intervention .  The patient's history has been reviewed, patient examined, no change in status, stable for surgery.  I have reviewed the patient's chart and labs.  Questions were answered to the patient's satisfaction.     Nilda Simmer

## 2017-08-07 NOTE — Anesthesia Procedure Notes (Signed)
Spinal  Patient location during procedure: OR Start time: 08/07/2017 11:16 AM End time: 08/07/2017 11:18 AM Staffing Anesthesiologist: Achille Rich, MD Performed: anesthesiologist  Preanesthetic Checklist Completed: patient identified, surgical consent, pre-op evaluation, timeout performed, IV checked, risks and benefits discussed and monitors and equipment checked Spinal Block Patient position: sitting Prep: DuraPrep Patient monitoring: cardiac monitor, continuous pulse ox and blood pressure Approach: midline Location: L3-4 Injection technique: single-shot Needle Needle type: Pencan  Needle gauge: 24 G Needle length: 9 cm Assessment Sensory level: T10 Additional Notes Functioning IV was confirmed and monitors were applied. Sterile prep and drape, including hand hygiene and sterile gloves were used. The patient was positioned and the spine was prepped. The skin was anesthetized with lidocaine.  Free flow of clear CSF was obtained prior to injecting local anesthetic into the CSF.  The spinal needle aspirated freely following injection.  The needle was carefully withdrawn.  The patient tolerated the procedure well.

## 2017-08-07 NOTE — Anesthesia Preprocedure Evaluation (Signed)
Anesthesia Evaluation  Patient identified by MRN, date of birth, ID band Patient awake    Reviewed: Allergy & Precautions, H&P , NPO status , Patient's Chart, lab work & pertinent test results  History of Anesthesia Complications (+) PONV and history of anesthetic complications  Airway Mallampati: II   Neck ROM: full    Dental   Pulmonary neg pulmonary ROS,    breath sounds clear to auscultation       Cardiovascular hypertension,  Rhythm:regular Rate:Normal     Neuro/Psych    GI/Hepatic   Endo/Other    Renal/GU      Musculoskeletal  (+) Arthritis ,   Abdominal   Peds  Hematology   Anesthesia Other Findings   Reproductive/Obstetrics                             Anesthesia Physical Anesthesia Plan  ASA: II  Anesthesia Plan: MAC and Spinal   Post-op Pain Management:  Regional for Post-op pain   Induction: Intravenous  PONV Risk Score and Plan: 3 and Ondansetron, Dexamethasone, Propofol infusion and Treatment may vary due to age or medical condition  Airway Management Planned: Simple Face Mask  Additional Equipment:   Intra-op Plan:   Post-operative Plan:   Informed Consent: I have reviewed the patients History and Physical, chart, labs and discussed the procedure including the risks, benefits and alternatives for the proposed anesthesia with the patient or authorized representative who has indicated his/her understanding and acceptance.     Plan Discussed with: CRNA, Anesthesiologist and Surgeon  Anesthesia Plan Comments:         Anesthesia Quick Evaluation

## 2017-08-07 NOTE — Progress Notes (Signed)
Orthopedic Tech Progress Note Patient Details:  Kendra Mathis 1950/12/10 [id number]  CPM Left Knee CPM Left Knee: On Left Knee Flexion (Degrees): 90 Left Knee Extension (Degrees): 0  Post Interventions Patient Tolerated: Well Instructions Provided: Care of device Ortho Devices Type of Ortho Device: Bone foam zero knee Ortho Device/Splint Interventions: Ordered, Application, Adjustment   Post Interventions Patient Tolerated: Well Instructions Provided: Care of device   Jennye Moccasin 08/07/2017, 3:26 PM

## 2017-08-07 NOTE — Anesthesia Procedure Notes (Signed)
Anesthesia Regional Block: Adductor canal block   Pre-Anesthetic Checklist: ,, timeout performed, Correct Patient, Correct Site, Correct Laterality, Correct Procedure, Correct Position, site marked, Risks and benefits discussed,  Surgical consent,  Pre-op evaluation,  At surgeon's request and post-op pain management  Laterality: Left  Prep: chloraprep       Needles:  Injection technique: Single-shot  Needle Type: Echogenic Needle     Needle Length: 9cm  Needle Gauge: 21     Additional Needles:   Narrative:  Start time: 08/07/2017 10:20 AM End time: 08/07/2017 10:24 AM Injection made incrementally with aspirations every 5 mL.  Performed by: Personally  Anesthesiologist: Achille Rich, MD  Additional Notes: Pt tolerated the procedure well.

## 2017-08-08 ENCOUNTER — Encounter (HOSPITAL_COMMUNITY): Payer: Self-pay | Admitting: Physician Assistant

## 2017-08-08 DIAGNOSIS — E876 Hypokalemia: Secondary | ICD-10-CM

## 2017-08-08 DIAGNOSIS — Z9889 Other specified postprocedural states: Secondary | ICD-10-CM

## 2017-08-08 DIAGNOSIS — R112 Nausea with vomiting, unspecified: Secondary | ICD-10-CM

## 2017-08-08 HISTORY — DX: Other specified postprocedural states: R11.2

## 2017-08-08 HISTORY — DX: Other specified postprocedural states: Z98.890

## 2017-08-08 HISTORY — DX: Hypokalemia: E87.6

## 2017-08-08 LAB — BASIC METABOLIC PANEL
ANION GAP: 10 (ref 5–15)
BUN: 14 mg/dL (ref 6–20)
CO2: 24 mmol/L (ref 22–32)
CREATININE: 0.69 mg/dL (ref 0.44–1.00)
Calcium: 8.3 mg/dL — ABNORMAL LOW (ref 8.9–10.3)
Chloride: 100 mmol/L — ABNORMAL LOW (ref 101–111)
Glucose, Bld: 156 mg/dL — ABNORMAL HIGH (ref 65–99)
POTASSIUM: 3 mmol/L — AB (ref 3.5–5.1)
SODIUM: 134 mmol/L — AB (ref 135–145)

## 2017-08-08 LAB — CBC
HEMATOCRIT: 33.6 % — AB (ref 36.0–46.0)
Hemoglobin: 11 g/dL — ABNORMAL LOW (ref 12.0–15.0)
MCH: 29.4 pg (ref 26.0–34.0)
MCHC: 32.7 g/dL (ref 30.0–36.0)
MCV: 89.8 fL (ref 78.0–100.0)
Platelets: 236 10*3/uL (ref 150–400)
RBC: 3.74 MIL/uL — AB (ref 3.87–5.11)
RDW: 13.9 % (ref 11.5–15.5)
WBC: 11 10*3/uL — ABNORMAL HIGH (ref 4.0–10.5)

## 2017-08-08 MED ORDER — POTASSIUM CHLORIDE CRYS ER 20 MEQ PO TBCR
40.0000 meq | EXTENDED_RELEASE_TABLET | Freq: Two times a day (BID) | ORAL | Status: DC
  Administered 2017-08-08 (×2): 40 meq via ORAL
  Filled 2017-08-08 (×2): qty 2

## 2017-08-08 MED ORDER — ACETAMINOPHEN 325 MG PO TABS
650.0000 mg | ORAL_TABLET | ORAL | Status: DC | PRN
  Administered 2017-08-08 – 2017-08-09 (×3): 650 mg via ORAL
  Filled 2017-08-08 (×3): qty 2

## 2017-08-08 NOTE — Progress Notes (Signed)
Subjective: 1 Day Post-Op Procedure(s) (LRB): TOTAL KNEE ARTHROPLASTY (Left) Patient reports pain as 5 on 0-10 scale.   Patient have significant difficulty with post op nausea and vomiting.   Objective: Vital signs in last 24 hours: Temp:  [97.4 F (36.3 C)-98.1 F (36.7 C)] 97.4 F (36.3 C) (05/21 0340) Pulse Rate:  [58-85] 79 (05/21 0340) Resp:  [12-20] 15 (05/21 0340) BP: (118-149)/(62-98) 135/79 (05/21 0340) SpO2:  [90 %-100 %] 95 % (05/21 0340)  Intake/Output from previous day: 05/20 0701 - 05/21 0700 In: 1940 [P.O.:240; I.V.:1700] Out: 1650 [Urine:1550; Blood:100] Intake/Output this shift: No intake/output data recorded.  Recent Labs    08/08/17 0553  HGB 11.0*   Recent Labs    08/08/17 0553  WBC 11.0*  RBC 3.74*  HCT 33.6*  PLT 236   Recent Labs    08/08/17 0553  NA 134*  K 3.0*  CL 100*  CO2 24  BUN 14  CREATININE 0.69  GLUCOSE 156*  CALCIUM 8.3*   No results for input(s): LABPT, INR in the last 72 hours.  ABD soft Neurovascular intact Sensation intact distally Intact pulses distally Incision: dressing C/D/I  Anticipated LOS equal to or greater than 2 midnights due to - Age 68 and older with one or more of the following:  - Obesity  - Expected need for hospital services (PT, OT, Nursing) required for safe  discharge  - Anticipated need for postoperative skilled nursing care or inpatient rehab  - Active co-morbidities: hypokalemia  OR   - Unanticipated findings during/Post Surgery: Lab abnormalities and Slow post-op progression: GI, pain control, mobility     Assessment/Plan: 1 Day Post-Op Procedure(s) (LRB): TOTAL KNEE ARTHROPLASTY (Left)  Principal Problem:   Primary localized osteoarthritis of left knee Active Problems:   Hypokalemia   Post-operative nausea and vomiting  Resume IV fluids at 125 hr.  Will add PO K BID in hopes of improving hypokalemia.  Zofran and Reglan for nausea.  D/C foley.  Will plan on discharge  tomorrow Advance diet Up with therapy Plan for discharge tomorrow    Pascal Lux 08/08/2017, 8:28 AM

## 2017-08-08 NOTE — Care Management Note (Signed)
Case Management Note  Patient Details  Name: Kendra Mathis MRN: [id number] Date of Birth: 1950-08-12  Subjective/Objective:   67 yr old female s/p left total knee arthroplasty.  Action/Plan: Case manager spoke with patient and her husband concerning discharge plan and DME. Patient was preoperatively setup with [facility], no changes. DME has been delivered to her home. She will have family support at discharge. .   Expected Discharge Date:   08/08/17               Expected Discharge Plan:  Home w Home Health Services  In-House Referral:  NA  Discharge planning Services  CM Consult  Post Acute Care Choice:  Durable Medical Equipment, Home Health Choice offered to:  Patient, Spouse  DME Arranged:  3-N-1, Walker youth, CPM DME Agency:  [employer]  HH Arranged:  PT HH Agency:  [facility]  Status of Service:  Completed, signed off  If discussed at [employer] [facility] [employer], dates discussed:    Additional Comments:  Cristy, [place]menares, RN 08/08/2017, 11:08 AM

## 2017-08-08 NOTE — Progress Notes (Addendum)
Physical Therapy Treatment Patient Details Name: Kendra Mathis MRN: [id number] DOB: [birth year] Today's Date: 08/08/2017    History of Present Illness 67 y.o. female admitted on 08/07/17 for elective L TKA.  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      PT Comments    Pt progressing well, reviewed all supine HEP, able to perform 90% without physical assistance,  increase in pain mostly associated with knee flexion ROM to 93%. Pt performing bed mobility, transfers, and AMB at supervision level and performance of 6 steps at [facility] assist for safety and line management. Pt noted to be quite drowsy, and consistently lightheaded throughout session irregardless of activity and/or position. Seated BP: 155/75. No N/V limitations this date. Pt's husband in attendance for stairs training. Pt progressing well toward goals.      Follow Up Recommendations  Follow surgeon's recommendation for DC plan and follow-up therapies;Supervision for mobility/OOB     Equipment Recommendations  Rolling walker with 5" wheels(pediatric RW )    Recommendations for Other Services       Precautions / Restrictions Precautions Precautions: Knee Precaution Booklet Issued: Yes (comment) Precaution Comments: knee exercise handout given and precautions reviewed.  Required Braces or Orthoses: Knee Immobilizer - Left Knee Immobilizer - Left: Other (comment)(DC once good quad control obtained) Restrictions Weight Bearing Restrictions: Yes LLE Weight Bearing: Weight bearing as tolerated    Mobility  Bed Mobility Overal bed mobility: Modified Independent Bed Mobility: Supine to Sit     Supine to sit: Modified independent (Device/Increase time)        Transfers Overall transfer level: Needs assistance Equipment used: Rolling walker (2 wheeled) Transfers: Sit to/from Stand Sit to Stand:  Supervision         General transfer comment: noted safe RW use  Ambulation/Gait Ambulation/Gait assistance: Supervision Ambulation Distance (Feet): 200 Feet Assistive device: Rolling walker (2 wheeled) Gait Pattern/deviations: WFL(Within Functional Limits);Step-through pattern;Antalgic Gait velocity: 0.56m/s    General Gait Details: mildly antalgic, minimally decreased flexion in swing phase on left, otherwise, trunk tall/upright, minimal deviation overall.    Stairs Stairs: Yes Stairs assistance: Min guard Stair Management: One rail Left;Step to pattern;Sideways Number of Stairs: 6     Wheelchair Mobility    Modified Rankin (Stroke Patients Only)       Balance Overall balance assessment: Modified Independent   Sitting balance-Leahy Scale: Normal       Standing balance-Leahy Scale: Good                              Cognition Arousal/Alertness: Lethargic;Suspect due to medications Behavior During Therapy: [facility] for tasks assessed/performed Overall Cognitive Status: Within Functional Limits for tasks assessed                                        Exercises Total Joint Exercises Ankle Circles/Pumps: AROM;Both;20 reps Quad Sets: AROM;10 reps;Left;Supine Short Arc Quad: AROM;Supine;Left;10 reps Heel Slides: AAROM;Supine;Left;10 reps Hip ABduction/ADduction: AROM;Supine;Left;10 reps Straight Leg Raises: AAROM;Supine;Left;10 reps(CGA needed only, no physical assist) Goniometric ROM: Left knee P/ROM: 16-93 degrees    General Comments        Pertinent Vitals/Pain Pain Assessment: 0-10 Pain Score: 6  Pain Location: left knee Pain Descriptors / Indicators: Grimacing;Guarding Pain Intervention(s): Limited activity within patient's  tolerance;Monitored during session;Premedicated before session    Home Living                      Prior Function            PT Goals (current goals can now be found in the care plan  section) Acute Rehab PT Goals Patient Stated Goal: to go home tomorrow PT Goal Formulation: With patient/family Time For Goal Achievement: 08/14/17 Potential to Achieve Goals: Good Progress towards PT goals: Progressing toward goals    Frequency    7X/week      PT Plan Current plan remains appropriate    Co-evaluation              AM-PAC PT "6 Clicks" Daily Activity  Outcome Measure  Difficulty turning over in bed (including adjusting bedclothes, sheets and blankets)?: A Little Difficulty moving from lying on back to sitting on the side of the bed? : A Little Difficulty sitting down on and standing up from a chair with arms (e.g., wheelchair, bedside commode, etc,.)?: A Little Help needed moving to and from a bed to chair (including a wheelchair)?: A Little Help needed walking in hospital room?: None Help needed climbing 3-5 steps with a railing? : A Little 6 Click Score: 19    End of Session Equipment Utilized During Treatment: Gait belt Activity Tolerance: Patient tolerated treatment well;Other (comment)(drowsy and lightheaded, suspected due to painmeds) Patient left: in chair;with call bell/phone within reach;with family/visitor present Nurse Communication: Mobility status PT Visit Diagnosis: Muscle weakness (generalized) (M62.81);Difficulty in walking, not elsewhere classified (R26.2);Pain Pain - Right/Left: Left Pain - part of body: Knee     Time: 1610-9604 PT Time Calculation (min) (ACUTE ONLY): 39 min  Charges:  $Gait Training: 8-22 mins $Therapeutic Exercise: 8-22 mins $Therapeutic Activity: 8-22 mins                    G Codes:       10:42 AM, September 06, 2017 Rosamaria Lints, PT, DPT Physical Therapist - [place] [phone] (Pager)  [phone] (Office)      Buccola,Allan C 2017/09/06, 10:42 AM

## 2017-08-08 NOTE — Plan of Care (Signed)
  Problem: Education: Goal: Knowledge of General Education information will improve Outcome: Progressing   

## 2017-08-08 NOTE — Progress Notes (Signed)
Physical Therapy Treatment Patient Details Name: Kendra Mathis MRN: [id number] DOB: Aug 04, 1950 Today's Date: 08/08/2017    History of Present Illness 67 y.o. female admitted on 08/07/17 for elective L TKA.  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      PT Comments    Pt mobilizing well this session, advancing AMB to 400+fet with RW, gait speed with nearly a 50% increase since AM session, pt able to perform all exercises independently, and educated on use of bed sheet to self assist with flexion stretches during heel slides in bed. Pt requesting return to CPM at end of session which PT assists. Pt is ready for DC to home from PT perspective at this time.     Follow Up Recommendations  Follow surgeon's recommendation for DC plan and follow-up therapies;Supervision for mobility/OOB     Equipment Recommendations  Rolling walker with 5" wheels(pediatric RW )    Recommendations for Other Services       Precautions / Restrictions Precautions Precautions: Knee Precaution Booklet Issued: Yes (comment) Precaution Comments: knee exercise handout given and precautions reviewed.  Required Braces or Orthoses: Knee Immobilizer - Left(until good quads control. ) Knee Immobilizer - Left: Other (comment) Restrictions LLE Weight Bearing: Weight bearing as tolerated    Mobility  Bed Mobility Overal bed mobility: Modified Independent Bed Mobility: Supine to Sit;Sit to Supine     Supine to sit: Supervision Sit to supine: Supervision      Transfers Overall transfer level: Modified independent Equipment used: Rolling walker (2 wheeled) Transfers: Sit to/from Stand           General transfer comment: noted safe RW use, 3 times in session, including from toilet  Ambulation/Gait   Ambulation Distance (Feet): 480 Feet Assistive device: Rolling walker (2 wheeled) Gait  Pattern/deviations: WFL(Within Functional Limits);Step-through pattern;Antalgic Gait velocity: 0.32ms this session (0.455m in AM session)    General Gait Details: moving better, more symetrical, still suprised by soreness in mid quads   Stairs             Wheelchair Mobility    Modified Rankin (Stroke Patients Only)       Balance Overall balance assessment: Modified Independent                                          Cognition Arousal/Alertness: Awake/alert Behavior During Therapy: WFL for tasks assessed/performed Overall Cognitive Status: Within Functional Limits for tasks assessed                                        Exercises Total Joint Exercises Quad Sets: AROM;Left;5 reps Heel Slides: AAROM;Supine;5 reps;Left(self assist with bedsheet) Hip ABduction/ADduction: AROM;Supine;Left;5 reps Straight Leg Raises: Supine;AROM;Left;5 reps Long Arc Quad: AROM;Left;5 reps;Seated(5sec hold time) Knee Flexion: AROM;Seated;Left;5 reps(5 sec hold time)    General Comments        Pertinent Vitals/Pain Pain Assessment: 0-10 Pain Score: 0-No pain(o/10 upon entry, some proximal thigh pain during AMB and stretches. ) Pain Location: left knee Pain Descriptors / Indicators: Grimacing;Guarding Pain Intervention(s): Limited activity within patient's tolerance;Monitored during session    Home Living  Prior Function            PT Goals (current goals can now be found in the care plan section) Acute Rehab PT Goals Patient Stated Goal: to go home tomorrow PT Goal Formulation: With patient/family Time For Goal Achievement: 08/14/17 Potential to Achieve Goals: Good Progress towards PT goals: Progressing toward goals;Goals met and updated - see care plan    Frequency    7X/week      PT Plan Current plan remains appropriate    Co-evaluation              AM-PAC PT "6 Clicks" Daily Activity   Outcome Measure  Difficulty turning over in bed (including adjusting bedclothes, sheets and blankets)?: A Little Difficulty moving from lying on back to sitting on the side of the bed? : A Little Difficulty sitting down on and standing up from a chair with arms (e.g., wheelchair, bedside commode, etc,.)?: A Little Help needed moving to and from a bed to chair (including a wheelchair)?: A Little Help needed walking in hospital room?: None Help needed climbing 3-5 steps with a railing? : A Little 6 Click Score: 19    End of Session   Activity Tolerance: Patient tolerated treatment well(lightheadedness is improved at this time, pt opted for tylenol v other pain meds) Patient left: in bed;with family/visitor present;in CPM Nurse Communication: Mobility status Pain - Right/Left: Left Pain - part of body: Knee     Time: 1412-1440 PT Time Calculation (min) (ACUTE ONLY): 28 min  Charges:  $Gait Training: 8-22 mins $Therapeutic Exercise: 8-22 mins                    G Codes:       2:51 PM, 08/22/2017 Etta Grandchild, PT, DPT Physical Therapist - [place] [phone] (Pager)  [phone] (Office)      Cloyde Oregel C 08/22/2017, 2:49 PM

## 2017-08-09 LAB — BASIC METABOLIC PANEL
Anion gap: 5 (ref 5–15)
BUN: 14 mg/dL (ref 6–20)
CO2: 22 mmol/L (ref 22–32)
CREATININE: 0.76 mg/dL (ref 0.44–1.00)
Calcium: 8.3 mg/dL — ABNORMAL LOW (ref 8.9–10.3)
Chloride: 113 mmol/L — ABNORMAL HIGH (ref 101–111)
GFR calc Af Amer: >60 mL/min (ref >60)
Glucose, Bld: 106 mg/dL — ABNORMAL HIGH (ref 65–99)
Potassium: 4.5 mmol/L (ref 3.5–5.1)
SODIUM: 140 mmol/L (ref 135–145)

## 2017-08-09 LAB — CBC
HCT: 29.7 % — ABNORMAL LOW (ref 36.0–46.0)
Hemoglobin: 9.7 g/dL — ABNORMAL LOW (ref 12.0–15.0)
MCH: 29.4 pg (ref 26.0–34.0)
MCHC: 32.7 g/dL (ref 30.0–36.0)
MCV: 90 fL (ref 78.0–100.0)
PLATELETS: 232 10*3/uL (ref 150–400)
RBC: 3.3 MIL/uL — ABNORMAL LOW (ref 3.87–5.11)
RDW: 14.6 % (ref 11.5–15.5)
WBC: 10.8 10*3/uL — ABNORMAL HIGH (ref 4.0–10.5)

## 2017-08-09 MED ORDER — GABAPENTIN 300 MG PO CAPS: 300.0000 mg | ORAL_CAPSULE | Freq: Every day | ORAL | 0 refills | Status: AC

## 2017-08-09 MED ORDER — OXYCODONE HCL 5 MG PO TABS: ORAL_TABLET | ORAL | 0 refills | Status: DC

## 2017-08-09 MED ORDER — ASPIRIN 325 MG PO TBEC: DELAYED_RELEASE_TABLET | ORAL | 0 refills | Status: AC

## 2017-08-09 MED ORDER — ACETAMINOPHEN 325 MG PO TABS: 650.0000 mg | ORAL_TABLET | ORAL | Status: AC | PRN

## 2017-08-09 NOTE — Plan of Care (Signed)
  Problem: Clinical Measurements: Goal: Ability to maintain clinical measurements within normal limits will improve Outcome: Progressing   Problem: Pain Managment: Goal: General experience of comfort will improve Outcome: Progressing   

## 2017-08-09 NOTE — Discharge Summary (Signed)
Patient ID: Kendra Mathis MRN: [id number] DOB/AGE: 1950/09/11 67 y.o.  Admit date: 08/07/2017 Discharge date: 08/09/2017  Admission Diagnoses:  Principal Problem:   Primary localized osteoarthritis of left knee Active Problems:   Hypokalemia   Post-operative nausea and vomiting   Discharge Diagnoses:  Same  Past Medical History:  Diagnosis Date  . Arthritis   . Back pain    scoliosis  . History of colon polyps    benign  . Hyperlipidemia    hx of-tried Lovastatin but joint aches -has been off for several yrs   . Hypertension    takes Lisinopril-HCTZ daily  . Hypokalemia 08/08/2017   3.2 preop   Started on Kdur BID 1 week preop.  Lower post op 3.0 despite 20 meq of K in her NS IV fluid going at 100 cc hour all night  . Nocturia   . Osteopenia    takes Evista every other day  . Palpitations    takes Inderal daily  . Pleurisy    > 15 yrs ago  . PONV (postoperative nausea and vomiting)   . Post-operative nausea and vomiting 08/08/2017  . Stress incontinence   . Urinary urgency     Surgeries: Procedure(s): TOTAL KNEE ARTHROPLASTY on 08/07/2017   Consultants:   Discharged Condition: Improved  Hospital Course: Kendra Mathis is an 67 y.o. female who was admitted 08/07/2017 for operative treatment ofPrimary localized osteoarthritis of left knee. Patient has severe unremitting pain that affects sleep, daily activities, and work/hobbies. After pre-op clearance the patient was taken to the operating room on 08/07/2017 and underwent  Procedure(s): TOTAL KNEE ARTHROPLASTY.    Patient was given perioperative antibiotics:  Anti-infectives (From admission, onward)   Start     Dose/Rate Route Frequency Ordered Stop   08/07/17 1830  ceFAZolin (ANCEF) IVPB 2g/100 mL premix     2 g 200 mL/hr over 30 Minutes Intravenous Every 6 hours 08/07/17 1729 08/08/17 0629   08/07/17 1150  cefUROXime (ZINACEF) injection  Status:  Discontinued       As needed 08/07/17 1151 08/07/17 1305    08/07/17 0930  ceFAZolin (ANCEF) IVPB 2g/100 mL premix     2 g 200 mL/hr over 30 Minutes Intravenous On call to O.R. 08/07/17 0915 08/07/17 1125       Patient was given sequential compression devices, early ambulation, and chemoprophylaxis to prevent DVT.  Patient benefited maximally from hospital stay and there were no complications.    Recent vital signs:  Patient Vitals for the past 24 hrs:  BP Temp Temp src Pulse Resp SpO2  08/09/17 0500 (!) 141/77 98.4 F (36.9 C) Oral 69 15 97 %  08/08/17 2100 137/71 98.4 F (36.9 C) Oral 74 16 97 %  08/08/17 1015 (!) 155/75 - - - - 95 %     Recent laboratory studies:  Recent Labs    08/08/17 0553 08/09/17 0347  WBC 11.0* 10.8*  HGB 11.0* 9.7*  HCT 33.6* 29.7*  PLT 236 232  NA 134* 140  K 3.0* 4.5  CL 100* 113*  CO2 24 22  BUN 14 14  CREATININE 0.69 0.76  GLUCOSE 156* 106*  CALCIUM 8.3* 8.3*     Discharge Medications:   Allergies as of 08/09/2017      Reactions   Codeine Nausea Only   Percocet ok   Benadryl [diphenhydramine] Other (See Comments)   Dizziness      Medication List    STOP taking these medications   naproxen  sodium 220 MG tablet Commonly known as:  ALEVE   Omega-3-6-9 Caps   pyridOXINE 100 MG tablet Commonly known as:  VITAMIN B-6     TAKE these medications   acetaminophen 325 MG tablet Commonly known as:  TYLENOL Take 2 tablets (650 mg total) by mouth every 4 (four) hours as needed (pain).   ALPRAZolam 0.5 MG tablet Commonly known as:  XANAX Take 0.25 mg by mouth daily as needed for anxiety.   aspirin 325 MG EC tablet 1 po q day for 30 days to prevent blood clots What changed:    how much to take  how to take this  when to take this  additional instructions   chlorpheniramine 4 MG tablet Commonly known as:  CHLOR-TRIMETON Take 4 mg by mouth daily as needed for allergies.   cholecalciferol 1000 units tablet Commonly known as:  VITAMIN D Take 1,000 Units by mouth daily.    docusate sodium 100 MG capsule Commonly known as:  COLACE Take 100 mg by mouth daily as needed for mild constipation.   gabapentin 300 MG capsule Commonly known as:  NEURONTIN Take 1 capsule (300 mg total) by mouth at bedtime.   lisinopril-hydrochlorothiazide 20-12.5 MG tablet Commonly known as:  PRINZIDE,ZESTORETIC Take 1 tablet by mouth daily.   NONFORMULARY OR COMPOUNDED ITEM [facility] compound:  Antiinflammatory Cream - Diclofenac 3%, Baclofen 2%, Cyclobenzaprine 2%, Lidocaine 2% dispense 180 grams apply 1-2 grams to affected area 3-4 times daily, +5refill. What changed:  additional instructions   oxyCODONE 5 MG immediate release tablet Commonly known as:  Oxy IR/ROXICODONE 1 po q 4 hrs prn pain.  Patient has a total knee replacement on 08/07/2017   potassium chloride 10 MEQ tablet Commonly known as:  K-DUR,KLOR-CON Take 10 mEq by mouth 2 (two) times daily.   propranolol 40 MG tablet Commonly known as:  INDERAL Take 40 mg by mouth 2 (two) times daily.   raloxifene 60 MG tablet Commonly known as:  EVISTA Take 60 mg by mouth daily.   SYSTANE OP Place 1 drop into both eyes daily.            Discharge Care Instructions  (From admission, onward)        Start     Ordered   08/09/17 0000  Change dressing    Comments:  DO NOT REMOVE BANDAGE OVER SURGICAL INCISION.  WASH WHOLE LEG INCLUDING OVER THE WATERPROOF BANDAGE WITH SOAP AND WATER EVERY DAY.   08/09/17 0731      Diagnostic Studies: No results found.  Disposition: Discharge disposition: 01-Home or Self Care       Discharge Instructions    CPM   Complete by:  As directed    Continuous passive motion machine (CPM):      Use the CPM from 0 to 90 for 6 hours per day.       You may break it up into 2 or 3 sessions per day.      Use CPM for 2 weeks or until you are told to stop.   Call MD / Call 911   Complete by:  As directed    If you experience chest pain or shortness of breath, CALL 911  and be transported to the hospital emergency room.  If you develope a fever above 101 F, pus (white drainage) or increased drainage or redness at the wound, or calf pain, call your surgeon's office.   Change dressing   Complete by:  As directed  DO NOT REMOVE BANDAGE OVER SURGICAL INCISION.  [facility]thren WHOLE LEG INCLUDING OVER THE WATERPROOF BANDAGE WITH SOAP AND WATER EVERY DAY.   Constipation Prevention   Complete by:  As directed    Drink plenty of fluids.  Prune juice may be helpful.  You may use a stool softener, such as Colace (over the counter) 100 mg twice a day.  Use MiraLax (over the counter) for constipation as needed.   Diet - low sodium heart healthy   Complete by:  As directed    Discharge instructions   Complete by:  As directed    INSTRUCTIONS AFTER JOINT REPLACEMENT   Remove items at home which could result in a fall. This includes throw rugs or furniture in walking pathways ICE to the affected joint every three hours while awake for 30 minutes at a time, for at least the first 3-5 days, and then as needed for pain and swelling.  Continue to use ice for pain and swelling. You may notice swelling that will progress down to the foot and ankle.  This is normal after surgery.  Elevate your leg when you are not up walking on it.   Continue to use the breathing machine you got in the hospital (incentive spirometer) which will help keep your temperature down.  It is common for your temperature to cycle up and down following surgery, especially at night when you are not up moving around and exerting yourself.  The breathing machine keeps your lungs expanded and your temperature down.   DIET:  As you were doing prior to hospitalization, we recommend a well-balanced diet.  DRESSING / WOUND CARE / SHOWERING  Keep the surgical dressing until follow up.  The dressing is water proof, so you can shower without any extra covering.  IF THE DRESSING FALLS OFF or the wound gets wet inside, change the  dressing with sterile gauze.  Please use good hand washing techniques before changing the dressing.  Do not use any lotions or creams on the incision until instructed by your surgeon.    ACTIVITY  Increase activity slowly as tolerated, but follow the weight bearing instructions below.   No driving for 6 weeks or until further direction given by your physician.  You cannot drive while taking narcotics.  No lifting or carrying greater than 10 lbs. until further directed by your surgeon. Avoid periods of inactivity such as sitting longer than an hour when not asleep. This helps prevent blood clots.  You may return to work once you are authorized by your doctor.     WEIGHT BEARING   Weight bearing as tolerated with assist device (walker, cane, etc) as directed, use it as long as suggested by your surgeon or therapist, typically at least 2-3 weeks.   EXERCISES  Results after joint replacement surgery are often greatly improved when you follow the exercise, range of motion and muscle strengthening exercises prescribed by your doctor. Safety measures are also important to protect the joint from further injury. Any time any of these exercises cause you to have increased pain or swelling, decrease what you are doing until you are comfortable again and then slowly increase them. If you have problems or questions, call your caregiver or physical therapist for advice.   Rehabilitation is important following a joint replacement. After just a few days of immobilization, the muscles of the leg can become weakened and shrink (atrophy).  These exercises are designed to build up the tone and strength of the thigh  and leg muscles and to improve motion. Often times heat used for twenty to thirty minutes before working out will loosen up your tissues and help with improving the range of motion but do not use heat for the first two weeks following surgery (sometimes heat can increase post-operative swelling).    These exercises can be done on a training (exercise) mat, on the floor, on a table or on a bed. Use whatever works the best and is most comfortable for you.    Use music or television while you are exercising so that the exercises are a pleasant break in your day. This will make your life better with the exercises acting as a break in your routine that you can look forward to.   Perform all exercises about fifteen times, three times per day or as directed.  You should exercise both the operative leg and the other leg as well.   Exercises include:  Quad Sets - Tighten up the muscle on the front of the thigh (Quad) and hold for 5-10 seconds.   Straight Leg Raises - With your knee straight (if you were given a brace, keep it on), lift the leg to 60 degrees, hold for 3 seconds, and slowly lower the leg.  Perform this exercise against resistance later as your leg gets stronger.  Leg Slides: Lying on your back, slowly slide your foot toward your buttocks, bending your knee up off the floor (only go as far as is comfortable). Then slowly slide your foot back down until your leg is flat on the floor again.  Angel Wings: Lying on your back spread your legs to the side as far apart as you can without causing discomfort.  Hamstring Strength:  Lying on your back, push your heel against the floor with your leg straight by tightening up the muscles of your buttocks.  Repeat, but this time bend your knee to a comfortable angle, and push your heel against the floor.  You may put a pillow under the heel to make it more comfortable if necessary.   A rehabilitation program following joint replacement surgery can speed recovery and prevent re-injury in the future due to weakened muscles. Contact your doctor or a physical therapist for more information on knee rehabilitation.    CONSTIPATION  Constipation is defined medically as fewer than three stools per week and severe constipation as less than one stool per week.   Even if you have a regular bowel pattern at home, your normal regimen is likely to be disrupted due to multiple reasons following surgery.  Combination of anesthesia, postoperative narcotics, change in appetite and fluid intake all can affect your bowels.   YOU MUST use at least one of the following options; they are listed in order of increasing strength to get the job done.  They are all available over the counter, and you may need to use some, POSSIBLY even all of these options:    Drink plenty of fluids (prune juice may be helpful) and high fiber foods Colace 100 mg by mouth twice a day  Senokot for constipation as directed and as needed Dulcolax (bisacodyl), take with full glass of water  Miralax (polyethylene glycol) once or twice a day as needed.  If you have tried all these things and are unable to have a bowel movement in the first 3-4 days after surgery call either your surgeon or your primary doctor.    If you experience loose stools or diarrhea, hold the medications  until you stool forms back up.  If your symptoms do not get better within 1 week or if they get worse, check with your doctor.  If you experience "the worst abdominal pain ever" or develop nausea or vomiting, please contact the office immediately for further recommendations for treatment.   ITCHING:  If you experience itching with your medications, try taking only a single pain pill, or even half a pain pill at a time.  You can also use Benadryl over the counter for itching or also to help with sleep.   TED HOSE STOCKINGS:  Use stockings on both legs until for at least 2 weeks or as directed by physician office. They may be removed at night for sleeping.  MEDICATIONS:  See your medication summary on the "After Visit Summary" that nursing will review with you.  You may have some home medications which will be placed on hold until you complete the course of blood thinner medication.  It is important for you to complete the  blood thinner medication as prescribed.  PRECAUTIONS:  If you experience chest pain or shortness of breath - call 911 immediately for transfer to the hospital emergency department.   If you develop a fever greater that 101 F, purulent drainage from wound, increased redness or drainage from wound, foul odor from the wound/dressing, or calf pain - CONTACT YOUR SURGEON.                                                   FOLLOW-UP APPOINTMENTS:  If you do not already have a post-op appointment, please call the office for an appointment to be seen by your surgeon.  Guidelines for how soon to be seen are listed in your "After Visit Summary", but are typically between 1-4 weeks after surgery.  OTHER INSTRUCTIONS:   Knee Replacement:  Do not place pillow under knee, focus on keeping the knee straight while resting. CPM instructions: 0-90 degrees, 2 hours in the morning, 2 hours in the afternoon, and 2 hours in the evening. Place foam block, curve side up under heel at all times except when in CPM or when walking.  DO NOT modify, tear, cut, or change the foam block in any way.  MAKE SURE YOU:  Understand these instructions.  Get help right away if you are not doing well or get worse.    Thank you for letting us be a part of your medical care team.  It is a privilege we respect greatly.  We hope these instructions will help you stay on track for a fast and full recovery!   Do not put a pillow under the knee. Place it under the heel.   Complete by:  As directed    Place gray foam block, curve side up under heel at all times except when in CPM or when walking.  DO NOT modify, tear, cut, or change in any way the gray foam block.   Increase activity slowly as tolerated   Complete by:  As directed    Patient may shower   Complete by:  As directed    Aquacel dressing is water proof    Wash over it and the whole leg with soap and water at the end of your shower   TED hose   Complete by:  As directed    Use  stockings (TED hose) for 2 weeks on both leg(s).  You may remove them at night for sleeping.      Follow-up Information    [facility] Follow up.   Specialty:  Home Health Services Why:  A repesentative from [facility] will contact you to arrange start date and time for your therapy. Contact information: [address] [place] [address] [phone]        Salvatore Marvel, MD Follow up on 08/21/2017.   Specialty:  Orthopedic Surgery Why:  appointment time 2:15   Physical therapy at 1 pm at Dr Sherene Sires office with Phill Mutter information: [address] [place] [address] [phone]        [facility] Follow up on 08/21/2017.   Specialty:  Orthopedic Surgery Why:  appointment time at 1 pm for physical therpy with [facility] information: [address] [place] [address] [phone]            Signed: Pascal Lux 08/09/2017, 7:31 AM

## 2017-08-10 DIAGNOSIS — Z96641 Presence of right artificial hip joint: Secondary | ICD-10-CM | POA: Diagnosis not present

## 2017-08-10 DIAGNOSIS — Z96652 Presence of left artificial knee joint: Secondary | ICD-10-CM | POA: Diagnosis not present

## 2017-08-10 DIAGNOSIS — I10 Essential (primary) hypertension: Secondary | ICD-10-CM | POA: Diagnosis not present

## 2017-08-10 DIAGNOSIS — Z471 Aftercare following joint replacement surgery: Secondary | ICD-10-CM | POA: Diagnosis not present

## 2017-08-10 DIAGNOSIS — Z7982 Long term (current) use of aspirin: Secondary | ICD-10-CM | POA: Diagnosis not present

## 2017-08-10 DIAGNOSIS — Z9181 History of falling: Secondary | ICD-10-CM | POA: Diagnosis not present

## 2017-08-11 DIAGNOSIS — I10 Essential (primary) hypertension: Secondary | ICD-10-CM | POA: Diagnosis not present

## 2017-08-11 DIAGNOSIS — Z9181 History of falling: Secondary | ICD-10-CM | POA: Diagnosis not present

## 2017-08-11 DIAGNOSIS — Z7982 Long term (current) use of aspirin: Secondary | ICD-10-CM | POA: Diagnosis not present

## 2017-08-11 DIAGNOSIS — Z471 Aftercare following joint replacement surgery: Secondary | ICD-10-CM | POA: Diagnosis not present

## 2017-08-11 DIAGNOSIS — Z96641 Presence of right artificial hip joint: Secondary | ICD-10-CM | POA: Diagnosis not present

## 2017-08-11 DIAGNOSIS — Z96652 Presence of left artificial knee joint: Secondary | ICD-10-CM | POA: Diagnosis not present

## 2017-08-14 DIAGNOSIS — Z96641 Presence of right artificial hip joint: Secondary | ICD-10-CM | POA: Diagnosis not present

## 2017-08-14 DIAGNOSIS — Z7982 Long term (current) use of aspirin: Secondary | ICD-10-CM | POA: Diagnosis not present

## 2017-08-14 DIAGNOSIS — Z96652 Presence of left artificial knee joint: Secondary | ICD-10-CM | POA: Diagnosis not present

## 2017-08-14 DIAGNOSIS — I10 Essential (primary) hypertension: Secondary | ICD-10-CM | POA: Diagnosis not present

## 2017-08-14 DIAGNOSIS — Z9181 History of falling: Secondary | ICD-10-CM | POA: Diagnosis not present

## 2017-08-14 DIAGNOSIS — Z471 Aftercare following joint replacement surgery: Secondary | ICD-10-CM | POA: Diagnosis not present

## 2017-08-15 DIAGNOSIS — Z01812 Encounter for preprocedural laboratory examination: Secondary | ICD-10-CM | POA: Diagnosis not present

## 2017-08-15 DIAGNOSIS — M1712 Unilateral primary osteoarthritis, left knee: Secondary | ICD-10-CM | POA: Diagnosis not present

## 2017-08-16 DIAGNOSIS — Z7982 Long term (current) use of aspirin: Secondary | ICD-10-CM | POA: Diagnosis not present

## 2017-08-16 DIAGNOSIS — Z96652 Presence of left artificial knee joint: Secondary | ICD-10-CM | POA: Diagnosis not present

## 2017-08-16 DIAGNOSIS — Z9181 History of falling: Secondary | ICD-10-CM | POA: Diagnosis not present

## 2017-08-16 DIAGNOSIS — Z471 Aftercare following joint replacement surgery: Secondary | ICD-10-CM | POA: Diagnosis not present

## 2017-08-16 DIAGNOSIS — I10 Essential (primary) hypertension: Secondary | ICD-10-CM | POA: Diagnosis not present

## 2017-08-16 DIAGNOSIS — Z96641 Presence of right artificial hip joint: Secondary | ICD-10-CM | POA: Diagnosis not present

## 2017-08-18 DIAGNOSIS — I10 Essential (primary) hypertension: Secondary | ICD-10-CM | POA: Diagnosis not present

## 2017-08-18 DIAGNOSIS — Z9181 History of falling: Secondary | ICD-10-CM | POA: Diagnosis not present

## 2017-08-18 DIAGNOSIS — Z7982 Long term (current) use of aspirin: Secondary | ICD-10-CM | POA: Diagnosis not present

## 2017-08-18 DIAGNOSIS — Z96641 Presence of right artificial hip joint: Secondary | ICD-10-CM | POA: Diagnosis not present

## 2017-08-18 DIAGNOSIS — Z471 Aftercare following joint replacement surgery: Secondary | ICD-10-CM | POA: Diagnosis not present

## 2017-08-18 DIAGNOSIS — Z96652 Presence of left artificial knee joint: Secondary | ICD-10-CM | POA: Diagnosis not present

## 2017-08-21 DIAGNOSIS — M25562 Pain in left knee: Secondary | ICD-10-CM | POA: Diagnosis not present

## 2017-08-21 DIAGNOSIS — M1712 Unilateral primary osteoarthritis, left knee: Secondary | ICD-10-CM | POA: Diagnosis not present

## 2017-08-21 DIAGNOSIS — M25662 Stiffness of left knee, not elsewhere classified: Secondary | ICD-10-CM | POA: Diagnosis not present

## 2017-08-21 DIAGNOSIS — M6281 Muscle weakness (generalized): Secondary | ICD-10-CM | POA: Diagnosis not present

## 2017-08-21 DIAGNOSIS — R262 Difficulty in walking, not elsewhere classified: Secondary | ICD-10-CM | POA: Diagnosis not present

## 2017-08-23 DIAGNOSIS — M25662 Stiffness of left knee, not elsewhere classified: Secondary | ICD-10-CM | POA: Diagnosis not present

## 2017-08-23 DIAGNOSIS — R262 Difficulty in walking, not elsewhere classified: Secondary | ICD-10-CM | POA: Diagnosis not present

## 2017-08-23 DIAGNOSIS — M6281 Muscle weakness (generalized): Secondary | ICD-10-CM | POA: Diagnosis not present

## 2017-08-23 DIAGNOSIS — M25562 Pain in left knee: Secondary | ICD-10-CM | POA: Diagnosis not present

## 2017-08-28 DIAGNOSIS — M6281 Muscle weakness (generalized): Secondary | ICD-10-CM | POA: Diagnosis not present

## 2017-08-28 DIAGNOSIS — M25662 Stiffness of left knee, not elsewhere classified: Secondary | ICD-10-CM | POA: Diagnosis not present

## 2017-08-28 DIAGNOSIS — R262 Difficulty in walking, not elsewhere classified: Secondary | ICD-10-CM | POA: Diagnosis not present

## 2017-08-28 DIAGNOSIS — M25562 Pain in left knee: Secondary | ICD-10-CM | POA: Diagnosis not present

## 2017-08-30 DIAGNOSIS — R262 Difficulty in walking, not elsewhere classified: Secondary | ICD-10-CM | POA: Diagnosis not present

## 2017-08-30 DIAGNOSIS — M6281 Muscle weakness (generalized): Secondary | ICD-10-CM | POA: Diagnosis not present

## 2017-08-30 DIAGNOSIS — M25662 Stiffness of left knee, not elsewhere classified: Secondary | ICD-10-CM | POA: Diagnosis not present

## 2017-08-30 DIAGNOSIS — M25562 Pain in left knee: Secondary | ICD-10-CM | POA: Diagnosis not present

## 2017-08-31 DIAGNOSIS — M25662 Stiffness of left knee, not elsewhere classified: Secondary | ICD-10-CM | POA: Diagnosis not present

## 2017-08-31 DIAGNOSIS — M6281 Muscle weakness (generalized): Secondary | ICD-10-CM | POA: Diagnosis not present

## 2017-08-31 DIAGNOSIS — M25562 Pain in left knee: Secondary | ICD-10-CM | POA: Diagnosis not present

## 2017-08-31 DIAGNOSIS — R262 Difficulty in walking, not elsewhere classified: Secondary | ICD-10-CM | POA: Diagnosis not present

## 2017-09-04 DIAGNOSIS — M25662 Stiffness of left knee, not elsewhere classified: Secondary | ICD-10-CM | POA: Diagnosis not present

## 2017-09-04 DIAGNOSIS — R262 Difficulty in walking, not elsewhere classified: Secondary | ICD-10-CM | POA: Diagnosis not present

## 2017-09-04 DIAGNOSIS — M6281 Muscle weakness (generalized): Secondary | ICD-10-CM | POA: Diagnosis not present

## 2017-09-04 DIAGNOSIS — M25562 Pain in left knee: Secondary | ICD-10-CM | POA: Diagnosis not present

## 2017-09-07 DIAGNOSIS — M25562 Pain in left knee: Secondary | ICD-10-CM | POA: Diagnosis not present

## 2017-09-07 DIAGNOSIS — R262 Difficulty in walking, not elsewhere classified: Secondary | ICD-10-CM | POA: Diagnosis not present

## 2017-09-07 DIAGNOSIS — M6281 Muscle weakness (generalized): Secondary | ICD-10-CM | POA: Diagnosis not present

## 2017-09-07 DIAGNOSIS — M25662 Stiffness of left knee, not elsewhere classified: Secondary | ICD-10-CM | POA: Diagnosis not present

## 2017-09-11 DIAGNOSIS — M25562 Pain in left knee: Secondary | ICD-10-CM | POA: Diagnosis not present

## 2017-09-11 DIAGNOSIS — R262 Difficulty in walking, not elsewhere classified: Secondary | ICD-10-CM | POA: Diagnosis not present

## 2017-09-11 DIAGNOSIS — M25662 Stiffness of left knee, not elsewhere classified: Secondary | ICD-10-CM | POA: Diagnosis not present

## 2017-09-11 DIAGNOSIS — M6281 Muscle weakness (generalized): Secondary | ICD-10-CM | POA: Diagnosis not present

## 2017-09-14 DIAGNOSIS — M25562 Pain in left knee: Secondary | ICD-10-CM | POA: Diagnosis not present

## 2017-09-14 DIAGNOSIS — R262 Difficulty in walking, not elsewhere classified: Secondary | ICD-10-CM | POA: Diagnosis not present

## 2017-09-14 DIAGNOSIS — M25662 Stiffness of left knee, not elsewhere classified: Secondary | ICD-10-CM | POA: Diagnosis not present

## 2017-09-14 DIAGNOSIS — M6281 Muscle weakness (generalized): Secondary | ICD-10-CM | POA: Diagnosis not present

## 2017-10-02 DIAGNOSIS — M25562 Pain in left knee: Secondary | ICD-10-CM | POA: Diagnosis not present

## 2017-10-25 ENCOUNTER — Ambulatory Visit (INDEPENDENT_AMBULATORY_CARE_PROVIDER_SITE_OTHER): Payer: Medicare Other | Admitting: Podiatry

## 2017-10-25 ENCOUNTER — Encounter: Payer: Self-pay | Admitting: Podiatry

## 2017-10-25 DIAGNOSIS — M7752 Other enthesopathy of left foot: Secondary | ICD-10-CM | POA: Diagnosis not present

## 2017-10-25 DIAGNOSIS — M779 Enthesopathy, unspecified: Secondary | ICD-10-CM

## 2017-10-25 MED ORDER — TRIAMCINOLONE ACETONIDE 10 MG/ML IJ SUSP
10.0000 mg | Freq: Once | INTRAMUSCULAR | Status: AC
  Administered 2017-10-25: 10 mg

## 2017-10-26 DIAGNOSIS — F419 Anxiety disorder, unspecified: Secondary | ICD-10-CM | POA: Diagnosis not present

## 2017-10-26 DIAGNOSIS — E785 Hyperlipidemia, unspecified: Secondary | ICD-10-CM | POA: Diagnosis not present

## 2017-10-26 DIAGNOSIS — I10 Essential (primary) hypertension: Secondary | ICD-10-CM | POA: Diagnosis not present

## 2017-10-26 DIAGNOSIS — M8580 Other specified disorders of bone density and structure, unspecified site: Secondary | ICD-10-CM | POA: Diagnosis not present

## 2017-10-26 NOTE — Progress Notes (Signed)
Subjective:   Patient ID: Kendra Mathis, female   DOB: 67 y.o.   MRN: [id number]   HPI Patient states my left foot is getting sore again on top and I did have my knee replaced   ROS      Objective:  Physical Exam  Neurovascular status intact with inflammation pain of the dorsal tendon complex left     Assessment:  Dorsal tendinitis of the left midfoot with probable arthritis     Plan:  Injection of the dorsal complex 3 mg Kenalog 5 mg Xylocaine and advised on heat ice therapy and reappoint as needed

## 2017-10-30 DIAGNOSIS — H33311 Horseshoe tear of retina without detachment, right eye: Secondary | ICD-10-CM | POA: Diagnosis not present

## 2017-10-30 DIAGNOSIS — H33321 Round hole, right eye: Secondary | ICD-10-CM | POA: Diagnosis not present

## 2017-10-30 DIAGNOSIS — M25562 Pain in left knee: Secondary | ICD-10-CM | POA: Diagnosis not present

## 2017-12-25 ENCOUNTER — Ambulatory Visit (INDEPENDENT_AMBULATORY_CARE_PROVIDER_SITE_OTHER): Payer: Medicare Other

## 2017-12-25 ENCOUNTER — Other Ambulatory Visit: Payer: Self-pay | Admitting: Podiatry

## 2017-12-25 ENCOUNTER — Ambulatory Visit (INDEPENDENT_AMBULATORY_CARE_PROVIDER_SITE_OTHER): Payer: Medicare Other | Admitting: Podiatry

## 2017-12-25 ENCOUNTER — Encounter: Payer: Self-pay | Admitting: Podiatry

## 2017-12-25 DIAGNOSIS — M779 Enthesopathy, unspecified: Secondary | ICD-10-CM | POA: Diagnosis not present

## 2017-12-25 DIAGNOSIS — M6740 Ganglion, unspecified site: Secondary | ICD-10-CM

## 2017-12-26 NOTE — Progress Notes (Signed)
Subjective:   Patient ID: Kendra Mathis, female   DOB: 67 y.o.   MRN: [id number]   HPI Patient presents with a small nodule that is come up on the dorsum of the left foot and she is concerned about what it is and states that the generalized pain she gets it has been pretty good   ROS      Objective:  Physical Exam  Neurovascular status intact with pain and inflammation of the dorsum of the left foot with a small lesion that measures about 5 x 5 mm but is not tender itself and appears to be freely movable with good subcutaneous tissue     Assessment:  Probability for benign soft tissue lesion left that may be related to trauma or other pathology     Plan:  I explained the only way to know definitively injury removal with pathology but I do not recommend it currently as it appears to be benign but I did give strict instructions if there should be any increase in size redness or pain that it would be best excised.  She agrees with this approach and is not interested in surgery currently  X-rays were negative for signs of calcification and did indicate moderate arthritic deformity

## 2018-01-24 ENCOUNTER — Telehealth: Payer: Self-pay | Admitting: Podiatry

## 2018-01-24 MED ORDER — NONFORMULARY OR COMPOUNDED ITEM: 2 refills | Status: AC

## 2018-01-24 MED ORDER — NONFORMULARY OR COMPOUNDED ITEM: 2 refills | Status: DC

## 2018-01-24 NOTE — Addendum Note (Signed)
Addended by: Alphia Kava D on: 01/24/2018 11:38 AM   Modules accepted: Orders

## 2018-01-24 NOTE — Telephone Encounter (Signed)
This is Personal assistant from [employer]. I have a refill request from Kendra Mathis for her pain cream. Her prescription is expired and she is requesting a new prescription. Our phone number is [phone] and our fax number is [phone].

## 2018-01-24 NOTE — Telephone Encounter (Signed)
This is Magazine features editor from [employer]. I just had a prescription called in but when they spoke to me, they did not use the same drugs that we actually dispensed in the prescription last time. I just want to verify we can do the prescription we did last time. You can call me back at [phone].

## 2018-01-24 NOTE — Telephone Encounter (Signed)
Kendra Mathis asked if could refill as previously with Ibuprofen, Baclofen, Gabapentin, Lidocaine for their physician approved substitute. I told marie that would be good.

## 2018-01-24 NOTE — Addendum Note (Signed)
Addended by: Alphia Kava D on: 01/24/2018 01:19 PM   Modules accepted: Orders

## 2018-01-24 NOTE — Telephone Encounter (Signed)
I informed [facility] [place], Dr. Charlsie Merles had refilled their Antiinflammatory pain cream +5refills.

## 2018-01-29 DIAGNOSIS — M1711 Unilateral primary osteoarthritis, right knee: Secondary | ICD-10-CM | POA: Diagnosis not present

## 2018-02-07 DIAGNOSIS — K648 Other hemorrhoids: Secondary | ICD-10-CM | POA: Diagnosis not present

## 2018-02-07 DIAGNOSIS — Z86010 Personal history of colonic polyps: Secondary | ICD-10-CM | POA: Diagnosis not present

## 2018-03-02 DIAGNOSIS — K648 Other hemorrhoids: Secondary | ICD-10-CM | POA: Diagnosis not present

## 2018-03-02 DIAGNOSIS — K5730 Diverticulosis of large intestine without perforation or abscess without bleeding: Secondary | ICD-10-CM | POA: Diagnosis not present

## 2018-03-02 DIAGNOSIS — Z86010 Personal history of colonic polyps: Secondary | ICD-10-CM | POA: Diagnosis not present

## 2018-03-06 DIAGNOSIS — G5601 Carpal tunnel syndrome, right upper limb: Secondary | ICD-10-CM | POA: Insufficient documentation

## 2018-03-07 DIAGNOSIS — G5601 Carpal tunnel syndrome, right upper limb: Secondary | ICD-10-CM | POA: Diagnosis not present

## 2018-03-19 DIAGNOSIS — Z1231 Encounter for screening mammogram for malignant neoplasm of breast: Secondary | ICD-10-CM | POA: Diagnosis not present

## 2018-03-19 DIAGNOSIS — Z803 Family history of malignant neoplasm of breast: Secondary | ICD-10-CM | POA: Diagnosis not present

## 2018-03-26 DIAGNOSIS — M1711 Unilateral primary osteoarthritis, right knee: Secondary | ICD-10-CM | POA: Diagnosis not present

## 2018-04-02 ENCOUNTER — Ambulatory Visit (INDEPENDENT_AMBULATORY_CARE_PROVIDER_SITE_OTHER): Payer: Medicare Other | Admitting: Podiatry

## 2018-04-02 ENCOUNTER — Encounter: Payer: Self-pay | Admitting: Podiatry

## 2018-04-02 DIAGNOSIS — M7752 Other enthesopathy of left foot: Secondary | ICD-10-CM | POA: Diagnosis not present

## 2018-04-02 DIAGNOSIS — M778 Other enthesopathies, not elsewhere classified: Secondary | ICD-10-CM

## 2018-04-02 DIAGNOSIS — M779 Enthesopathy, unspecified: Principal | ICD-10-CM

## 2018-04-02 MED ORDER — TRIAMCINOLONE ACETONIDE 10 MG/ML IJ SUSP
10.0000 mg | Freq: Once | INTRAMUSCULAR | Status: AC
  Administered 2018-04-02: 10 mg

## 2018-04-09 NOTE — Progress Notes (Signed)
Subjective:   Patient ID: Kendra Mathis, adult   DOB: 68 y.o.   MRN: [id number]   HPI Patient presents stating I am doing better but I am still getting quite a bit of discomfort in the dorsum of my left foot if I do too much but I have improved with medication but I just need this occasionally   ROS      Objective:  Physical Exam  Neurovascular status intact with patient's left dorsal foot symptomatic to a moderate nature with inflammation of the dorsal tendon complex      Assessment:  Tendinitis dorsal left is inflamed and painful     Plan:  Advised on continuation of conservative care sterile prep applied and injected the dorsal tendon complex 3 mg Kenalog 5 mg Xylocaine

## 2018-05-08 DIAGNOSIS — Z1389 Encounter for screening for other disorder: Secondary | ICD-10-CM | POA: Diagnosis not present

## 2018-05-08 DIAGNOSIS — F419 Anxiety disorder, unspecified: Secondary | ICD-10-CM | POA: Diagnosis not present

## 2018-05-08 DIAGNOSIS — Z23 Encounter for immunization: Secondary | ICD-10-CM | POA: Diagnosis not present

## 2018-05-08 DIAGNOSIS — G4700 Insomnia, unspecified: Secondary | ICD-10-CM | POA: Diagnosis not present

## 2018-05-08 DIAGNOSIS — Z0000 Encounter for general adult medical examination without abnormal findings: Secondary | ICD-10-CM | POA: Diagnosis not present

## 2018-05-08 DIAGNOSIS — I10 Essential (primary) hypertension: Secondary | ICD-10-CM | POA: Diagnosis not present

## 2018-05-08 DIAGNOSIS — M8580 Other specified disorders of bone density and structure, unspecified site: Secondary | ICD-10-CM | POA: Diagnosis not present

## 2018-05-08 DIAGNOSIS — E785 Hyperlipidemia, unspecified: Secondary | ICD-10-CM | POA: Diagnosis not present

## 2018-08-09 ENCOUNTER — Ambulatory Visit (INDEPENDENT_AMBULATORY_CARE_PROVIDER_SITE_OTHER): Payer: Medicare Other | Admitting: Podiatry

## 2018-08-09 ENCOUNTER — Encounter: Payer: Self-pay | Admitting: Podiatry

## 2018-08-09 ENCOUNTER — Other Ambulatory Visit: Payer: Self-pay

## 2018-08-09 VITALS — Temp 97.5°F

## 2018-08-09 DIAGNOSIS — M779 Enthesopathy, unspecified: Secondary | ICD-10-CM | POA: Diagnosis not present

## 2018-08-09 DIAGNOSIS — M778 Other enthesopathies, not elsewhere classified: Secondary | ICD-10-CM

## 2018-08-09 MED ORDER — TRIAMCINOLONE ACETONIDE 10 MG/ML IJ SUSP
10.0000 mg | Freq: Once | INTRAMUSCULAR | Status: AC
  Administered 2018-08-09: 10 mg

## 2018-08-09 NOTE — Progress Notes (Signed)
Subjective:   Patient ID: Kendra Mathis, adult   DOB: 68 y.o.   MRN: [id number]   HPI Patient presents stating that she has developed a lot of pain on top of her left foot.  States it was good for about 3-1/2 4 months   ROS      Objective:  Physical Exam  Acute tendinitis dorsal left foot in the extensor complex with arthritic issues and chronic element to this condition     Assessment:  Extensor tendinitis left     Plan:  Sterile prep and injected the extensor tendon complex 3 mg Kenalog 5 mg Xylocaine and continue with same procedure

## 2018-11-02 ENCOUNTER — Other Ambulatory Visit: Payer: Self-pay

## 2018-11-02 ENCOUNTER — Ambulatory Visit (INDEPENDENT_AMBULATORY_CARE_PROVIDER_SITE_OTHER): Payer: Medicare Other | Admitting: Podiatry

## 2018-11-02 ENCOUNTER — Encounter: Payer: Self-pay | Admitting: Podiatry

## 2018-11-02 VITALS — Temp 98.0°F

## 2018-11-02 DIAGNOSIS — M779 Enthesopathy, unspecified: Secondary | ICD-10-CM

## 2018-11-02 DIAGNOSIS — M778 Other enthesopathies, not elsewhere classified: Secondary | ICD-10-CM

## 2018-11-06 DIAGNOSIS — I10 Essential (primary) hypertension: Secondary | ICD-10-CM | POA: Diagnosis not present

## 2018-11-06 DIAGNOSIS — M8580 Other specified disorders of bone density and structure, unspecified site: Secondary | ICD-10-CM | POA: Diagnosis not present

## 2018-11-06 DIAGNOSIS — F419 Anxiety disorder, unspecified: Secondary | ICD-10-CM | POA: Diagnosis not present

## 2018-11-06 DIAGNOSIS — E785 Hyperlipidemia, unspecified: Secondary | ICD-10-CM | POA: Diagnosis not present

## 2018-11-07 NOTE — Progress Notes (Signed)
Subjective:   Patient ID: Kendra Mathis, adult   DOB: 68 y.o.   MRN: [id number]   HPI Patient presents stating I am having some discomfort in my left foot and I am getting ready to move out of town and I wanted it treated 1 more time   ROS      Objective:  Physical Exam  Neurovascular status intact with inflammation pain of the dorsal tendon complex left with fluid buildup     Assessment:  Dorsal extensor tendinitis left with inflammation fluid buildup     Plan:  Sterile prep and went ahead and injected the dorsal tendon complex left 3 mg Kenalog 5 mg Xylocaine which was tolerated well

## 2018-12-26 DIAGNOSIS — Z23 Encounter for immunization: Secondary | ICD-10-CM | POA: Diagnosis not present

## 2019-01-23 DIAGNOSIS — Z6824 Body mass index (BMI) 24.0-24.9, adult: Secondary | ICD-10-CM | POA: Diagnosis not present

## 2019-01-23 DIAGNOSIS — I10 Essential (primary) hypertension: Secondary | ICD-10-CM | POA: Diagnosis not present

## 2019-01-23 DIAGNOSIS — M8580 Other specified disorders of bone density and structure, unspecified site: Secondary | ICD-10-CM | POA: Diagnosis not present

## 2019-01-23 DIAGNOSIS — G5601 Carpal tunnel syndrome, right upper limb: Secondary | ICD-10-CM | POA: Diagnosis not present

## 2019-01-23 DIAGNOSIS — F413 Other mixed anxiety disorders: Secondary | ICD-10-CM | POA: Diagnosis not present

## 2019-04-08 DIAGNOSIS — M19031 Primary osteoarthritis, right wrist: Secondary | ICD-10-CM | POA: Diagnosis not present

## 2019-04-08 DIAGNOSIS — G5601 Carpal tunnel syndrome, right upper limb: Secondary | ICD-10-CM | POA: Diagnosis not present

## 2019-04-08 DIAGNOSIS — M25531 Pain in right wrist: Secondary | ICD-10-CM | POA: Diagnosis not present

## 2019-04-29 DIAGNOSIS — F411 Generalized anxiety disorder: Secondary | ICD-10-CM | POA: Diagnosis not present

## 2019-04-29 DIAGNOSIS — G5600 Carpal tunnel syndrome, unspecified upper limb: Secondary | ICD-10-CM | POA: Diagnosis not present

## 2019-04-29 DIAGNOSIS — M8580 Other specified disorders of bone density and structure, unspecified site: Secondary | ICD-10-CM | POA: Diagnosis not present

## 2019-04-29 DIAGNOSIS — I10 Essential (primary) hypertension: Secondary | ICD-10-CM | POA: Diagnosis not present

## 2019-04-29 DIAGNOSIS — M1990 Unspecified osteoarthritis, unspecified site: Secondary | ICD-10-CM | POA: Diagnosis not present

## 2019-04-29 DIAGNOSIS — Z6821 Body mass index (BMI) 21.0-21.9, adult: Secondary | ICD-10-CM | POA: Diagnosis not present

## 2019-05-06 DIAGNOSIS — M19031 Primary osteoarthritis, right wrist: Secondary | ICD-10-CM | POA: Diagnosis not present

## 2019-05-06 DIAGNOSIS — G5601 Carpal tunnel syndrome, right upper limb: Secondary | ICD-10-CM | POA: Diagnosis not present

## 2019-05-13 DIAGNOSIS — M79672 Pain in left foot: Secondary | ICD-10-CM | POA: Diagnosis not present

## 2019-05-13 DIAGNOSIS — M19072 Primary osteoarthritis, left ankle and foot: Secondary | ICD-10-CM | POA: Diagnosis not present
